# Patient Record
Sex: Male | Born: 2000 | Race: Black or African American | Hispanic: No | Marital: Single | Smoking: Never smoker
Health system: Southern US, Community
[De-identification: ages and names within clinical notes are randomized; demographics above are authoritative.]

## PROBLEM LIST (undated history)

## (undated) DIAGNOSIS — F32A Depression, unspecified: Secondary | ICD-10-CM

## (undated) DIAGNOSIS — F909 Attention-deficit hyperactivity disorder, unspecified type: Secondary | ICD-10-CM

## (undated) DIAGNOSIS — E669 Obesity, unspecified: Secondary | ICD-10-CM

## (undated) DIAGNOSIS — H109 Unspecified conjunctivitis: Secondary | ICD-10-CM

## (undated) DIAGNOSIS — J45909 Unspecified asthma, uncomplicated: Secondary | ICD-10-CM

## (undated) DIAGNOSIS — F418 Other specified anxiety disorders: Secondary | ICD-10-CM

## (undated) DIAGNOSIS — F509 Eating disorder, unspecified: Secondary | ICD-10-CM

## (undated) DIAGNOSIS — G4730 Sleep apnea, unspecified: Secondary | ICD-10-CM

## (undated) DIAGNOSIS — L309 Dermatitis, unspecified: Secondary | ICD-10-CM

## (undated) DIAGNOSIS — R011 Cardiac murmur, unspecified: Secondary | ICD-10-CM

## (undated) DIAGNOSIS — F419 Anxiety disorder, unspecified: Secondary | ICD-10-CM

## (undated) DIAGNOSIS — F329 Major depressive disorder, single episode, unspecified: Secondary | ICD-10-CM

## (undated) DIAGNOSIS — T7840XA Allergy, unspecified, initial encounter: Secondary | ICD-10-CM

## (undated) HISTORY — DX: Eating disorder, unspecified: F50.9

## (undated) HISTORY — DX: Unspecified asthma, uncomplicated: J45.909

## (undated) HISTORY — DX: Sleep apnea, unspecified: G47.30

## (undated) HISTORY — DX: Major depressive disorder, single episode, unspecified: F32.9

## (undated) HISTORY — DX: Unspecified conjunctivitis: H10.9

## (undated) HISTORY — DX: Allergy, unspecified, initial encounter: T78.40XA

## (undated) HISTORY — DX: Morbid (severe) obesity due to excess calories: E66.01

## (undated) HISTORY — DX: Depression, unspecified: F32.A

## (undated) HISTORY — DX: Attention-deficit hyperactivity disorder, unspecified type: F90.9

## (undated) HISTORY — DX: Obesity, unspecified: E66.9

## (undated) HISTORY — DX: Anxiety disorder, unspecified: F41.9

## (undated) HISTORY — PX: CIRCUMCISION: SUR203

---
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
## 2021-03-17 ENCOUNTER — Other Ambulatory Visit: Payer: Self-pay | Admitting: Pediatrics

## 2021-03-18 ENCOUNTER — Telehealth (HOSPITAL_COMMUNITY): Payer: Self-pay

## 2021-03-18 NOTE — Telephone Encounter (Signed)
Called patient to see if he is interested in the Cardiac Rehab Program. Patient expressed interest. Explained scheduling process and went over insurance, patient verbalized understanding.  ?

## 2021-03-18 NOTE — Telephone Encounter (Signed)
Pt insurance is active and benefits verified through [place]. Co-pay $0.00, DED $750.00/$750.00 met, out of pocket $2,250.00/$818.22 met, co-insurance 20%. No pre-authorization required. Shaine/Cinga, 03/18/21 @ 3:34PM, [id number]   Will contact patient to see if he is interested in the Cardiac Rehab Program.

## 2021-03-18 NOTE — Telephone Encounter (Signed)
Outside/paper referral received by Dr. Casilda Carls from [place]. Will fax over Physician order and request further documents. Insurance benefits and eligibility to be determined.

## 2021-03-30 ENCOUNTER — Other Ambulatory Visit: Payer: Self-pay | Admitting: Pediatrics

## 2021-04-12 ENCOUNTER — Telehealth (HOSPITAL_COMMUNITY): Payer: Self-pay

## 2021-04-12 NOTE — Telephone Encounter (Signed)
Refaxed pt MD order and other paperwork that needs to be filled out signed by the MD to Dr. Kennon Rounds office. 2nd request for cardiac rehab.

## 2021-04-16 NOTE — Telephone Encounter (Signed)
Called patient to see if he was interested in participating in the Cardiac Rehab Program. Patient's mother stated yes. Patient will come in for orientation on 05/20/2021@8 :00am and will attend the 3:15pm exercise class.   Pensions consultant.

## 2021-05-18 ENCOUNTER — Telehealth (HOSPITAL_COMMUNITY): Payer: Self-pay

## 2021-05-20 ENCOUNTER — Encounter (HOSPITAL_COMMUNITY): Payer: Self-pay

## 2021-05-20 ENCOUNTER — Encounter (HOSPITAL_COMMUNITY)
Admission: RE | Admit: 2021-05-20 | Discharge: 2021-05-20 | Disposition: A | Discharge status: Home / Self Care | Payer: Managed Care, Other (non HMO) | Source: Ambulatory Visit | Attending: Cardiology | Admitting: Cardiology

## 2021-05-20 ENCOUNTER — Other Ambulatory Visit (HOSPITAL_COMMUNITY): Payer: Self-pay | Admitting: *Deleted

## 2021-05-20 ENCOUNTER — Other Ambulatory Visit: Payer: Self-pay

## 2021-05-20 VITALS — BP 118/78 | HR 86 | Ht 67.0 in | Wt >= 6400 oz

## 2021-05-20 DIAGNOSIS — I422 Other hypertrophic cardiomyopathy: Secondary | ICD-10-CM | POA: Insufficient documentation

## 2021-05-20 NOTE — Progress Notes (Signed)
Cardiac Rehab Medication Review by a Nurse  Does the patient  feel that his/her medications are working for him/her?  YES   Has the patient been experiencing any side effects to the medications prescribed?   NO  Does the patient measure his/her own blood pressure or blood glucose at home?  YES   Does the patient have any problems obtaining medications due to transportation or finances?   NO  Understanding of regimen: good Understanding of indications: good Potential of compliance: good    Nurse comments: [name] is taking his medications as prescribed and has a good understanding of what his medications are for. [name] says that his mother has a blood pressure monitor. Devin Marshall checks his blood pressures sometimes at home.    [name] [facility] RN 05/20/2021 9:20 AM

## 2021-05-20 NOTE — Progress Notes (Addendum)
Cardiac Individual Treatment Plan  Patient Details  Name: Devin Marshall MRN: [id number] Date of Birth: 05-09-2001 Referring Provider:   Flowsheet Row CARDIAC REHAB PHASE II ORIENTATION from 05/20/2021 in [facility] CARDIAC REHAB  Referring Provider Dr Trisha Mangle MD, [facility]/ Armanda Magic, MD  (coverage)       Initial Encounter Date:  Flowsheet Row CARDIAC REHAB PHASE II ORIENTATION from 05/20/2021 in [facility] CARDIAC REHAB  Date 05/20/21       Visit Diagnosis: Hypertrophic cardiomyopathy (HCC)  Patient's Home Medications on Admission:  Current Outpatient Medications:    albuterol (PROVENTIL) (2.5 MG/3ML) 0.083% nebulizer solution, Take 3 mLs (2.5 mg total) by nebulization every 4 (four) hours as needed for wheezing or shortness of breath., Disp: 75 mL, Rfl: 12   albuterol (VENTOLIN HFA) 108 (90 Base) MCG/ACT inhaler, INHALE 2 PUFFS INTO THE LUNGS EVERY 4 HOURS AS NEEDED FOR WHEEZING OR SHORTNESS OF BREATH., Disp: 6.7 each, Rfl: 13   budesonide (PULMICORT) 0.5 MG/2ML nebulizer solution, TAKE 2 MLS (0.5 MG TOTAL) BY NEBULIZATION 2 (TWO) TIMES DAILY. (Patient taking differently: Take 0.5 mg by nebulization 2 (two) times daily as needed (shortness of breath).), Disp: 360 mL, Rfl: 4   metoprolol succinate (TOPROL-XL) 50 MG 24 hr tablet, Take 50 mg by mouth daily., Disp: , Rfl:    pantoprazole (PROTONIX) 40 MG tablet, Take 40 mg by mouth daily., Disp: , Rfl:    fluticasone (FLOVENT HFA) 110 MCG/ACT inhaler, Inhale 2 puffs into the lungs 2 (two) times daily. (Patient not taking: Reported on 05/20/2021), Disp: 1 Inhaler, Rfl: 12  Past Medical History: Past Medical History:  Diagnosis Date   ADHD (attention deficit hyperactivity disorder)    Allergy    Anxiety    Asthma    Asthma    Phreesia 01/28/2020   Conjunctivitis 05/08/2013   Depression    Eating disorder    Eczema    Morbid obesity (HCC)    Obesity    Situational anxiety    Sleep  apnea    Undiagnosed cardiac murmurs 12/10/2012    Tobacco Use: Social History   Tobacco Use  Smoking Status Never   Passive exposure: Yes  Smokeless Tobacco Never  Tobacco Comments   both parents smoke    Labs: Recent Review Flowsheet Data     Labs for ITP Cardiac and Pulmonary Rehab Latest Ref Rng & Units 07/30/2013 01/29/2014 08/05/2014 06/18/2015 10/12/2017   Cholestrol 100 - 169 mg/dL - - - 409 811(B)   LDLCALC 0 - 109 mg/dL - - - 147(W) 295(A)   HDL >39 mg/dL - - - 21(H) 08(M)   Trlycerides 0 - 89 mg/dL - - - 578 72   Hemoglobin A1c 4.8 - 5.6 % 5.1 5.1 - 5.3 5.2   TCO2 0 - 100 mmol/L - - 24 - -       Capillary Blood Glucose: No results found for: GLUCAP   Exercise Target Goals: Exercise Program Goal: Individual exercise prescription set using results from initial 6 min walk test and THRR while considering  patient's activity barriers and safety.   Exercise Prescription Goal: Starting with aerobic activity 30 plus minutes a day, 3 days per week for initial exercise prescription. Provide home exercise prescription and guidelines that participant acknowledges understanding prior to discharge.  Activity Barriers & Risk Stratification:  Activity Barriers & Cardiac Risk Stratification - 05/20/21 1002       Activity Barriers & Cardiac Risk Stratification   Activity  Barriers Deconditioning;Muscular Weakness;Shortness of Breath;Decreased Ventricular Function;Balance Concerns;Other (comment)    Comments Morbid Obesity    Cardiac Risk Stratification High             6 Minute Walk:  6 Minute Walk     Row Name 05/20/21 1000         6 Minute Walk   Phase Initial     Distance 1181 feet  Used Nustep for test     Walk Time 6 minutes     # of Rest Breaks 0     MPH 2.34     METS 2.98     RPE 8     Perceived Dyspnea  1     VO2 Peak 10.43     Symptoms Yes (comment)     Comments Bilateral hip pain 3/10; SOB RPD = 1     Resting HR 91 bpm     Resting BP 118/78      Resting Oxygen Saturation  98 %     Exercise Oxygen Saturation  during 6 min walk 97 %     Max Ex. HR 94 bpm     Max Ex. BP 124/84     2 Minute Post BP 114/76              Oxygen Initial Assessment:   Oxygen Re-Evaluation:   Oxygen Discharge (Final Oxygen Re-Evaluation):   Initial Exercise Prescription:  Initial Exercise Prescription - 05/20/21 1000       Date of Initial Exercise RX and Referring Provider   Date 05/20/21    Referring Provider Dr Trisha Mangle MD, [facility]/ Armanda Magic, MD  (coverage)    Expected Discharge Date 07/16/21      T5 Nustep   Level 2    SPM 75    Minutes 25    METs 1.8      Prescription Details   Frequency (times per week) 3    Duration Progress to 30 minutes of continuous aerobic without signs/symptoms of physical distress      Intensity   THRR 40-80% of Max Heartrate 81-161    Ratings of Perceived Exertion 11-13    Perceived Dyspnea 0-4      Progression   Progression Continue progressive overload as per policy without signs/symptoms or physical distress.      Resistance Training   Training Prescription Yes    Weight 5 lbs    Reps 10-15             Perform Capillary Blood Glucose checks as needed.  Exercise Prescription Changes:   Exercise Comments:   Exercise Goals and Review:   Exercise Goals     Row Name 05/20/21 1007             Exercise Goals   Increase Physical Activity Yes       Intervention Provide advice, education, support and counseling about physical activity/exercise needs.;Develop an individualized exercise prescription for aerobic and resistive training based on initial evaluation findings, risk stratification, comorbidities and participant's personal goals.       Expected Outcomes Short Term: Attend rehab on a regular basis to increase amount of physical activity.;Long Term: Add in home exercise to make exercise part of routine and to increase amount of physical activity.;Long Term:  Exercising regularly at least 3-5 days a week.       Increase Strength and Stamina Yes       Intervention Provide advice, education, support and counseling about physical activity/exercise needs.;Develop  an individualized exercise prescription for aerobic and resistive training based on initial evaluation findings, risk stratification, comorbidities and participant's personal goals.       Expected Outcomes Short Term: Increase workloads from initial exercise prescription for resistance, speed, and METs.;Short Term: Perform resistance training exercises routinely during rehab and add in resistance training at home;Long Term: Improve cardiorespiratory fitness, muscular endurance and strength as measured by increased METs and functional capacity ( )       Able to understand and use rate of perceived exertion (RPE) scale Yes       Intervention Provide education and explanation on how to use RPE scale       Expected Outcomes Short Term: Able to use RPE daily in rehab to express subjective intensity level;Long Term:  Able to use RPE to guide intensity level when exercising independently       Knowledge and understanding of Target Heart Rate Range (THRR) Yes       Intervention Provide education and explanation of THRR including how the numbers were predicted and where they are located for reference       Expected Outcomes Short Term: Able to state/look up THRR;Short Term: Able to use daily as guideline for intensity in rehab;Long Term: Able to use THRR to govern intensity when exercising independently       Understanding of Exercise Prescription Yes       Intervention Provide education, explanation, and written materials on patient's individual exercise prescription       Expected Outcomes Short Term: Able to explain program exercise prescription;Long Term: Able to explain home exercise prescription to exercise independently                Exercise Goals Re-Evaluation :    Discharge Exercise  Prescription (Final Exercise Prescription Changes):   Nutrition:  Target Goals: Understanding of nutrition guidelines, daily intake of sodium 1500mg , cholesterol 200mg , calories 30% from fat and 7% or less from saturated fats, daily to have 5 or more servings of fruits and vegetables.  Biometrics:  Pre Biometrics - 05/20/21 0955       Pre Biometrics   Waist Circumference 68 inches    Hip Circumference 66.5 inches    Waist to Hip Ratio 1.02 %    Triceps Skinfold 70 mm    % Body Fat 62.6 %    Grip Strength 35 kg    Flexibility --   Not done   Single Leg Stand 5.61 seconds              Nutrition Therapy Plan and Nutrition Goals:   Nutrition Assessments:  MEDIFICTS Score Key: ?70 Need to make dietary changes  40-70 Heart Healthy Diet ? 40 Therapeutic Level Cholesterol Diet   Picture Your Plate Scores: <32 Unhealthy dietary pattern with much room for improvement. 41-50 Dietary pattern unlikely to meet recommendations for good health and room for improvement. 51-60 More healthful dietary pattern, with some room for improvement.  >60 Healthy dietary pattern, although there may be some specific behaviors that could be improved.    Nutrition Goals Re-Evaluation:   Nutrition Goals Discharge (Final Nutrition Goals Re-Evaluation):   Psychosocial: Target Goals: Acknowledge presence or absence of significant depression and/or stress, maximize coping skills, provide positive support system. Participant is able to verbalize types and ability to use techniques and skills needed for reducing stress and depression.  Initial Review & Psychosocial Screening:  Initial Psych Review & Screening - 05/20/21 9518       Initial Review  Current issues with Current Depression;History of Depression;Current Stress Concerns    Source of Stress Concerns Chronic Illness;Unable to participate in former interests or hobbies;Unable to perform yard/household activities;Transportation     Comments Darryll is unable to participate in many ADL's due to morbid obesity. Torey graduated from high school in June and would like to enroll in school to learn a trade      [employer]   Good Support System? Yes   Oluwadamilola has his parents, and younger brother for suport     Barriers   Psychosocial barriers to participate in program The patient should benefit from training in stress management and relaxation.      Screening Interventions   Interventions Encouraged to exercise;To provide support and resources with identified psychosocial needs;Provide feedback about the scores to participant    Expected Outcomes Long Term Goal: Stressors or current issues are controlled or eliminated.;Short Term goal: Utilizing psychosocial counselor, staff and physician to assist with identification of specific Stressors or current issues interfering with healing process. Setting desired goal for each stressor or current issue identified.;Short Term goal: Identification and review with participant of any Quality of Life or Depression concerns found by scoring the questionnaire.;Long Term goal: The participant improves quality of Life and PHQ9 Scores as seen by post scores and/or verbalization of changes             Quality of Life Scores:  Quality of Life - 05/20/21 1040       Quality of Life   Select Quality of Life      Quality of Life Scores   Health/Function Pre 17.63 %    Socioeconomic Pre 23.94 %    Psych/Spiritual Pre 27.43 %    Family Pre 15.9 %    GLOBAL Pre 20.79 %            Scores of 19 and below usually indicate a poorer quality of life in these areas.  A difference of  2-3 points is a clinically meaningful difference.  A difference of 2-3 points in the total score of the Quality of Life Index has been associated with significant improvement in overall quality of life, self-image, physical symptoms, and general health in studies assessing change in quality of  life.  PHQ-9: Recent Review Flowsheet Data     Depression screen [facility] 2/9 05/20/2021 08/27/2020 10/12/2017 08/16/2017 08/10/2016   Decreased Interest 1 0 3 2 0   Down, Depressed, Hopeless 2 2 3 3 1    PHQ - 2 Score 3 2 6 5 1    Altered sleeping 3 3 3 2  0   Tired, decreased energy 2 1 2 2  0   Change in appetite 2 0 3 2 1    Feeling bad or failure about yourself  2 2 3 2  0   Trouble concentrating 3 3 3 1  0   Moving slowly or fidgety/restless 0 1 0 2 0   Suicidal thoughts 0 - 0 0 0   PHQ-9 Score 15 12 20 16 2    Difficult doing work/chores Somewhat difficult - Not difficult at all Somewhat difficult -      Interpretation of Total Score  Total Score Depression Severity:  1-4 = Minimal depression, 5-9 = Mild depression, 10-14 = Moderate depression, 15-19 = Moderately severe depression, 20-27 = Severe depression   Psychosocial Evaluation and Intervention:   Psychosocial Re-Evaluation:   Psychosocial Discharge (Final Psychosocial Re-Evaluation):   Vocational Rehabilitation: Provide vocational rehab assistance to qualifying candidates.   Vocational  Rehab Evaluation & Intervention:  Vocational Rehab - 05/20/21 0856       Initial Vocational Rehab Evaluation & Intervention   Assessment shows need for Vocational Rehabilitation Yes    Vocational Rehab Packet given to patient 05/20/21             Education: Education Goals: Education classes will be provided on a weekly basis, covering required topics. Participant will state understanding/return demonstration of topics presented.  Learning Barriers/Preferences:  Learning Barriers/Preferences - 05/20/21 0957       Learning Barriers/Preferences   Learning Barriers Sight   wears glasses for reading; H/O ADHD   Learning Preferences Audio;Pictoral;Skilled Demonstration;Computer/Internet;Group Instruction;Verbal Instruction;Individual Instruction;Video;Written Material             Education Topics: Hypertension, Hypertension  Reduction -Define heart disease and high blood pressure. Discus how high blood pressure affects the body and ways to reduce high blood pressure.   Exercise and Your Heart -Discuss why it is important to exercise, the FITT principles of exercise, normal and abnormal responses to exercise, and how to exercise safely.   Angina -Discuss definition of angina, causes of angina, treatment of angina, and how to decrease risk of having angina.   Cardiac Medications -Review what the following cardiac medications are used for, how they affect the body, and side effects that may occur when taking the medications.  Medications include Aspirin, Beta blockers, calcium channel blockers, ACE Inhibitors, angiotensin receptor blockers, diuretics, digoxin, and antihyperlipidemics.   Congestive Heart Failure -Discuss the definition of CHF, how to live with CHF, the signs and symptoms of CHF, and how keep track of weight and sodium intake.   Heart Disease and Intimacy -Discus the effect sexual activity has on the heart, how changes occur during intimacy as we age, and safety during sexual activity.   Smoking Cessation / COPD -Discuss different methods to quit smoking, the health benefits of quitting smoking, and the definition of COPD.   Nutrition I: Fats -Discuss the types of cholesterol, what cholesterol does to the heart, and how cholesterol levels can be controlled.   Nutrition II: Labels -Discuss the different components of food labels and how to read food label   Heart Parts/Heart Disease and PAD -Discuss the anatomy of the heart, the pathway of blood circulation through the heart, and these are affected by heart disease.   Stress I: Signs and Symptoms -Discuss the causes of stress, how stress may lead to anxiety and depression, and ways to limit stress.   Stress II: Relaxation -Discuss different types of relaxation techniques to limit stress.   Warning Signs of Stroke / TIA -Discuss  definition of a stroke, what the signs and symptoms are of a stroke, and how to identify when someone is having stroke.   Knowledge Questionnaire Score:  Knowledge Questionnaire Score - 05/20/21 1041       Knowledge Questionnaire Score   Pre Score 21/24             Core Components/Risk Factors/Patient Goals at Admission:  Personal Goals and Risk Factors at Admission - 05/20/21 0959       Core Components/Risk Factors/Patient Goals on Admission    Weight Management Yes;Obesity;Weight Loss    Intervention Weight Management: Develop a combined nutrition and exercise program designed to reach desired caloric intake, while maintaining appropriate intake of nutrient and fiber, sodium and fats, and appropriate energy expenditure required for the weight goal.;Weight Management: Provide education and appropriate resources to help participant work on and attain dietary  goals.;Weight Management/Obesity: Establish reasonable short term and long term weight goals.;Obesity: Provide education and appropriate resources to help participant work on and attain dietary goals.    Admit Weight 438 lb 15 oz (199.1 kg)    Expected Outcomes Understanding of distribution of calorie intake throughout the day with the consumption of 4-5 meals/snacks;Understanding recommendations for meals to include 15-35% energy as protein, 25-35% energy from fat, 35-60% energy from carbohydrates, less than 200mg  of dietary cholesterol, 20-35 gm of total fiber daily;Weight Loss: Understanding of general recommendations for a balanced deficit meal plan, which promotes 1-2 lb weight loss per week and includes a negative energy balance of [phone] kcal/d;Weight Maintenance: Understanding of the daily nutrition guidelines, which includes 25-35% calories from fat, 7% or less cal from saturated fats, less than 200mg  cholesterol, less than 1.5gm of sodium, & 5 or more servings of fruits and vegetables daily;Long Term: Adherence to nutrition  and physical activity/exercise program aimed toward attainment of established weight goal;Short Term: Continue to assess and modify interventions until short term weight is achieved    Improve shortness of breath with ADL's Yes    Intervention Provide education, individualized exercise plan and daily activity instruction to help decrease symptoms of SOB with activities of daily living.    Expected Outcomes Short Term: Improve cardiorespiratory fitness to achieve a reduction of symptoms when performing ADLs;Long Term: Be able to perform more ADLs without symptoms or delay the onset of symptoms    Heart Failure Yes    Intervention Provide a combined exercise and nutrition program that is supplemented with education, support and counseling about heart failure. Directed toward relieving symptoms such as shortness of breath, decreased exercise tolerance, and extremity edema.    Expected Outcomes Improve functional capacity of life;Short term: Attendance in program 2-3 days a week with increased exercise capacity. Reported lower sodium intake. Reported increased fruit and vegetable intake. Reports medication compliance.;Short term: Daily weights obtained and reported for increase. Utilizing diuretic protocols set by physician.;Long term: Adoption of self-care skills and reduction of barriers for early signs and symptoms recognition and intervention leading to self-care maintenance.    Hypertension Yes    Intervention Provide education on lifestyle modifcations including regular physical activity/exercise, weight management, moderate sodium restriction and increased consumption of fresh fruit, vegetables, and low fat dairy, alcohol moderation, and smoking cessation.;Monitor prescription use compliance.    Expected Outcomes Short Term: Continued assessment and intervention until BP is < 140/35mm HG in hypertensive participants. < 130/46mm HG in hypertensive participants with diabetes, heart failure or chronic  kidney disease.;Long Term: Maintenance of blood pressure at goal levels.    Stress Yes    Intervention Offer individual and/or small group education and counseling on adjustment to heart disease, stress management and health-related lifestyle change. Teach and support self-help strategies.;Refer participants experiencing significant psychosocial distress to appropriate mental health specialists for further evaluation and treatment. When possible, include family members and significant others in education/counseling sessions.    Expected Outcomes Short Term: Participant demonstrates changes in health-related behavior, relaxation and other stress management skills, ability to obtain effective social support, and compliance with psychotropic medications if prescribed.;Long Term: Emotional wellbeing is indicated by absence of clinically significant psychosocial distress or social isolation.             Core Components/Risk Factors/Patient Goals Review:    Core Components/Risk Factors/Patient Goals at Discharge (Final Review):    ITP Comments:  ITP Comments     Row Name 05/20/21 [phone]  ITP Comments Dr Armanda Magic MD, Medical Director                Comments: Jaegar attended orientation on 05/20/2021 to review rules and guidelines for program.  Completed 6 minute nustep  test, A 6 minute nustep test was Initiated rather than a 6 minute walk test as [name] did not bring his rescue inhaler today. Patient knows to bring his inhaler to his next appointment.  ITP, and exercise prescription.  VSS. Telemetry-Sinus Rhythm. Kennon did report having 3/10 bilateral hip pain and 1/4 shortness of breath. This resolved with rest.  Safety measures and social distancing in place per CDC guidelines. Mart's weight is up since his last office visit in July with his cardiologist at [facility] with Dr Casilda Carls. Left message at Dr Kennon Rounds office about Dewell's current weight. Truitt says  that he is still dealing with depression will ask Cardiologist or Dr Loel Ro about receiving counseling. Bashir was also given a vocational rehab referral form. [place] is interested in going to school. Devinn is interested in attending community college and possibly a 4 year college. Transportation was set up for Kiptyn to participate in cardiac rehab as he does not drive or have transportation. Lanorris's mother is starting a job with social services on Monday.  A referral was initiated for [facility] to receive nutrition counseling.Gladstone Lighter, RN,BSN 05/20/2021 1:09 PM

## 2021-05-21 ENCOUNTER — Telehealth: Payer: Self-pay

## 2021-05-21 NOTE — Telephone Encounter (Signed)
Cardiac rehab reach out concerning Devin Marshall's weight increasing to 139lb as well as a conversation concerning depression. They decided to continue with cardiac rehab and also scheduled him for Nutrition Management starting in November but they called asking to start with Behavioral health concerning the depression.   Office still would like to speak to Dr. [name] give call back number of [phone]

## 2021-05-24 ENCOUNTER — Encounter (HOSPITAL_COMMUNITY)
Admission: RE | Admit: 2021-05-24 | Discharge: 2021-05-24 | Disposition: A | Discharge status: Home / Self Care | Payer: Managed Care, Other (non HMO) | Source: Ambulatory Visit | Attending: Cardiology | Admitting: Cardiology

## 2021-05-24 ENCOUNTER — Other Ambulatory Visit: Payer: Self-pay

## 2021-05-24 ENCOUNTER — Ambulatory Visit (HOSPITAL_COMMUNITY): Payer: Managed Care, Other (non HMO)

## 2021-05-24 DIAGNOSIS — I422 Other hypertrophic cardiomyopathy: Secondary | ICD-10-CM

## 2021-05-24 NOTE — Progress Notes (Signed)
QUALITY OF LIFE SCORE REVIEW  Pt completed Quality of Life survey as a participant in Cardiac Rehab.  Scores 21.0 or below are considered low.  Pt score very low in several areas Overall 20.79, Health and Function 17.63, socioeconomic 23.94, physiological and spiritual 27.43, family 15.90. Patient quality of life slightly altered by physical constraints which limits ability to perform as prior to recent cardiac illness.Devin Marshall admitted to being depressed during orientation. Denies suicidal ideations.  Offered emotional support and reassurance. Devin Marshall admits to being dissatisfied with his weight and is worried about his heart. Devin Marshall also voices concerns about not having a job. Awaiting Devin Marshall to return the paper referral for vocational rehab.   Will continue to monitor and intervene as necessary.  Devin Marshall has an appointment with Ernest Haber tomorrow.Will obtain fax number to fax paper copy of quality of life questionnaire.Thayer Headings RN BSN

## 2021-05-24 NOTE — Progress Notes (Signed)
Daily Session Note  Patient Details  Name: Devin Marshall MRN: [id number] Date of Birth: [birth year] Referring Provider:   Flowsheet Row CARDIAC REHAB PHASE II ORIENTATION from 05/20/2021 in [place]  Referring Provider Dr Gaylyn Cheers MD, [facility]/ Fransico Him, MD  (coverage)       Encounter Date: 05/24/2021  Check In:  Session Check In - 05/24/21 1323       Check-In   Supervising physician immediately available to respond to emergencies [facility] immediately available    Physician(s) Dr. Florene Glen    Location [facility]    Staff Present Barnet Pall, RN, Milus Glazier, MS, ACSM-CEP, CCRP, Exercise Physiologist;Olinty Celesta Aver, MS, ACSM CEP, Exercise Physiologist;Carlette Wilber Oliphant, RN, BSN;Ramon Dredge, RN, MHA    Virtual Visit No    Medication changes reported     No    Fall or balance concerns reported    No    Tobacco Cessation No Change    Current number of cigarettes/nicotine per day     0    Warm-up and Cool-down Performed on first and last piece of equipment    Resistance Training Performed Yes    VAD Patient? No    PAD/SET Patient? No      Pain Assessment   Currently in Pain? No/denies    Pain Score 0-No pain    Multiple Pain Sites No             Capillary Blood Glucose: No results found for this or any previous visit (from the past 24 hour(s)).   Exercise Prescription Changes - 05/24/21 1400       Response to Exercise   Blood Pressure (Admit) 118/60    Blood Pressure (Exercise) 124/80    Blood Pressure (Exit) 114/70    Heart Rate (Admit) 99 bpm    Heart Rate (Exercise) 107 bpm    Heart Rate (Exit) 89 bpm    Rating of Perceived Exertion (Exercise) 13    Symptoms Fatigue/Deconditioning    Comments Pt's first day in the CRP2 program    Duration Continue with 30 min of aerobic exercise without signs/symptoms of physical distress.    Intensity THRR unchanged      Progression    Progression Continue to progress workloads to maintain intensity without signs/symptoms of physical distress.    Average METs 1.8      Resistance Training   Training Prescription Yes    Weight 5 lbs    Reps 10-15    Time 10 Minutes      Interval Training   Interval Training No      T5 Nustep   Level 2    SPM 75    Minutes 20    METs 1.8             Social History   Tobacco Use  Smoking Status Never   Passive exposure: Yes  Smokeless Tobacco Never  Tobacco Comments   both parents smoke    Goals Met:  Strength training completed today  Goals Unmet:  Not Applicable  Comments: Devin Marshall started cardiac rehab today.  Pt tolerated light exercise using rest breaks and stopped at 20 minutes on the nustep. Patient is very deconditioned. Devin Marshall reported having left hip pain. VSS, telemetry-Sinus Rhythm, Medication list reconciled. Pt denies barriers to medicaiton compliance.  PSYCHOSOCIAL ASSESSMENT:  PHQ-15.Devin Marshall admits to being depressed due to his weight and heart condition. Devin Marshall does have a supportive family including  his parents and  brothers . Quality of life questionnaire reviewed please see note.    Pt enjoys cars and car maintenance.   Pt oriented to exercise equipment and routine.    Understanding verbalized. Barnet Pall, RN,BSN 05/25/2021 8:09 AM    Dr. Fransico Him is Medical Director for Cardiac Rehab at [facility].

## 2021-05-26 ENCOUNTER — Ambulatory Visit (HOSPITAL_COMMUNITY): Payer: Managed Care, Other (non HMO)

## 2021-05-26 ENCOUNTER — Other Ambulatory Visit: Payer: Self-pay

## 2021-05-26 ENCOUNTER — Encounter (HOSPITAL_COMMUNITY)
Admission: RE | Admit: 2021-05-26 | Discharge: 2021-05-26 | Disposition: A | Discharge status: Home / Self Care | Payer: Managed Care, Other (non HMO) | Source: Ambulatory Visit | Attending: Cardiology | Admitting: Cardiology

## 2021-05-26 DIAGNOSIS — I422 Other hypertrophic cardiomyopathy: Secondary | ICD-10-CM

## 2021-05-27 ENCOUNTER — Ambulatory Visit (INDEPENDENT_AMBULATORY_CARE_PROVIDER_SITE_OTHER): Payer: Managed Care, Other (non HMO) | Admitting: Clinical

## 2021-05-27 DIAGNOSIS — F4323 Adjustment disorder with mixed anxiety and depressed mood: Secondary | ICD-10-CM

## 2021-05-27 NOTE — BH Specialist Note (Signed)
Integrated Behavioral Health via Telemedicine Visit  05/27/2021 Devin Marshall [id number]  11:06am- Sent video link [phone] 11:12pm - TC to [place] - will get on video link   Number of Integrated Behavioral Health visits: 1  Session Start time: 11:15am  Session End time: 12pm Total time: 45   Referring Provider: Cardiac Rehab Team (Dr. [name] is PCP) Patient/Family location: Pt's home [facility] Provider location: [facility] All persons participating in visit: Devin Marshall) Types of Service: Individual psychotherapy and Video visit  I connected with Devin Marshall via  Telephone or Video Enabled Telemedicine Application  (Video is Caregility application) and verified that I am speaking with the correct person using two identifiers. Discussed confidentiality: Yes   I discussed the limitations of telemedicine and the availability of in person appointments.  Discussed there is a possibility of technology failure and discussed alternative modes of communication if that failure occurs.  I discussed that engaging in this telemedicine visit, they consent to the provision of behavioral healthcare and the services will be billed under their insurance.  Patient and/or legal guardian expressed understanding and consented to Telemedicine visit: Yes   Presenting Concerns: Patient and/or family reports the following symptoms/concerns:  - wants to take care of himself more but having difficulties with doing that due to depressive symptoms and lack of confidence Duration of problem: months; Severity of problem: moderate  Patient and/or Family's Strengths/Protective Factors: Social and Emotional competence, Concrete supports in place (healthy food, safe environments, etc.), and Sense of purpose  Goals Addressed: Patient will:  Increase knowledge and/or ability of: self-management skills - to improve his quality of life.  Demonstrate ability to:  consistently  put on CPAP at night time in order to improve his quality of sleep.  Progress towards Goals: Ongoing  Interventions: Interventions utilized:  Motivational Interviewing, Mining engineer, and Link to [employer] Assessments completed: PHQ-SADS PHQ-SADS Last 3 Score only 05/27/2021 05/20/2021 08/27/2020  PHQ-15 Score 11 - 7  Total GAD-7 Score 12 - 10  PHQ Adolescent Score 16 15 12     Patient and/or Family Response:  Devin Marshall reported moderately severe symptoms of depression.  He is motivated to change and has started to act on caring for himself, eg taking showers each day and starting to clean his room. Devin Marshall is adjusting to not having a set schedule since he graduated high school earlier in the year.  Devin Marshall was able to identify self-care activities and develop a plan to improve his sleep, which he acknowledges will help him feel better in general.  Self-Care: Making music, Making cars (mod) video games) Taking showers & cleaning room  Assessment: Patient currently experiencing depressive symptoms which has made it difficult for him to take care of himself.  He is motivated to do things differently and was able to develop a plan to improve his sleep by putting on his CPAP.  Devin Marshall identified different solutions to help him put on his CPAP including reminders and positive self-talk.   Patient may benefit from completing his plan to put on his CPAP at night to improve his quality of sleep which will improve his quality of life.  That accomplishment will get him more motivated to complete other self-care activities.  Plan: Follow up with behavioral health clinician on : 06/03/21 virtual follow up until he gets fully connected with psycho therapy in the community Behavioral recommendations:  - Set reminders to put on his CPAP & put it on at night -  Follow up with psycho therapy Referral(s): Community Mental Health Services (LME/Outside Clinic) - Devin Marshall  wanted to be referred for community based psycho therapy  I discussed the assessment and treatment plan with the patient and/or parent/guardian. They were provided an opportunity to ask questions and all were answered. They agreed with the plan and demonstrated an understanding of the instructions.   They were advised to call back or seek an in-person evaluation if the symptoms worsen or if the condition fails to improve as anticipated.  Tiziana Cislo Ed Blalock, LCSW

## 2021-05-27 NOTE — Telephone Encounter (Signed)
Will refer to psychiatry and dietitian

## 2021-05-28 ENCOUNTER — Other Ambulatory Visit: Payer: Self-pay

## 2021-05-28 ENCOUNTER — Encounter (HOSPITAL_COMMUNITY): Payer: Managed Care, Other (non HMO)

## 2021-05-28 ENCOUNTER — Ambulatory Visit (HOSPITAL_COMMUNITY): Payer: Managed Care, Other (non HMO)

## 2021-05-28 DIAGNOSIS — E662 Morbid (severe) obesity with alveolar hypoventilation: Secondary | ICD-10-CM

## 2021-05-28 DIAGNOSIS — F411 Generalized anxiety disorder: Secondary | ICD-10-CM

## 2021-05-28 DIAGNOSIS — Z68.41 Body mass index (BMI) pediatric, greater than or equal to 95th percentile for age: Secondary | ICD-10-CM

## 2021-05-31 ENCOUNTER — Other Ambulatory Visit: Payer: Self-pay

## 2021-05-31 ENCOUNTER — Ambulatory Visit (HOSPITAL_COMMUNITY): Payer: Managed Care, Other (non HMO)

## 2021-05-31 ENCOUNTER — Encounter (HOSPITAL_COMMUNITY)
Admission: RE | Admit: 2021-05-31 | Discharge: 2021-05-31 | Disposition: A | Discharge status: Home / Self Care | Payer: Managed Care, Other (non HMO) | Source: Ambulatory Visit | Attending: Cardiology | Admitting: Cardiology

## 2021-05-31 DIAGNOSIS — I422 Other hypertrophic cardiomyopathy: Secondary | ICD-10-CM | POA: Insufficient documentation

## 2021-05-31 DIAGNOSIS — I5032 Chronic diastolic (congestive) heart failure: Secondary | ICD-10-CM | POA: Diagnosis present

## 2021-06-02 ENCOUNTER — Ambulatory Visit (HOSPITAL_COMMUNITY): Payer: Managed Care, Other (non HMO)

## 2021-06-02 ENCOUNTER — Encounter (HOSPITAL_COMMUNITY)
Admission: RE | Admit: 2021-06-02 | Discharge: 2021-06-02 | Disposition: A | Discharge status: Home / Self Care | Payer: Managed Care, Other (non HMO) | Source: Ambulatory Visit | Attending: Cardiology | Admitting: Cardiology

## 2021-06-02 ENCOUNTER — Other Ambulatory Visit: Payer: Self-pay

## 2021-06-02 DIAGNOSIS — I5032 Chronic diastolic (congestive) heart failure: Secondary | ICD-10-CM | POA: Diagnosis not present

## 2021-06-02 DIAGNOSIS — I422 Other hypertrophic cardiomyopathy: Secondary | ICD-10-CM

## 2021-06-03 ENCOUNTER — Ambulatory Visit (INDEPENDENT_AMBULATORY_CARE_PROVIDER_SITE_OTHER): Payer: Managed Care, Other (non HMO) | Admitting: Clinical

## 2021-06-03 DIAGNOSIS — F4323 Adjustment disorder with mixed anxiety and depressed mood: Secondary | ICD-10-CM

## 2021-06-03 NOTE — BH Specialist Note (Signed)
Integrated Behavioral Health via Telemedicine Visit  06/03/2021 Devin Marshall [id number]  Number of Integrated Behavioral Health visits: 2 Session Start time: 11:40 AMSession End time: 12:07 PM Total time:  27  min  Referring Provider: Dr. [name] & Cardiac Rehab Team Patient/Family location: Pt's home [facility] Provider location: [facility] All persons participating in visit: Devin Marshall & Devin Marshall. Devin Marshall [facility]) Types of Service: Individual psychotherapy and Video visit  I connected with Devin Marshall  via  Telephone or Video Enabled Telemedicine Application  (Video is Caregility application) and verified that I am speaking with the correct person using two identifiers. Discussed confidentiality: Yes   I discussed the limitations of telemedicine and the availability of in person appointments.  Discussed there is a possibility of technology failure and discussed alternative modes of communication if that failure occurs.  I discussed that engaging in this telemedicine visit, they consent to the provision of behavioral healthcare and the services will be billed under their insurance.  Patient and/or legal guardian expressed understanding and consented to Telemedicine visit: Yes   Presenting Concerns: Patient and/or family reports the following symptoms/concerns:  - Was able to do 3-4 nights with the CPAP, felt better  Duration of problem: weeks to months; Severity of problem: moderate  Patient and/or Family's Strengths/Protective Factors: Concrete supports in place (healthy food, safe environments, etc.) and Sense of purpose    Progress towards Goals: Ongoing  Interventions: Interventions utilized:  Motivational Interviewing and Goal Setting Standardized Assessments completed: Not Needed  Patient and/or Family Response:  Devin Marshall was able to do 3-4 nights with the CPAP and willing to do 5 nights this week since he reported he felt good after sleeping with the CPAP.   - put it  on at night (Sun, Mon, Tues) - sticky note that's bright pink on the CPAP helped him remember to put it on.  Devin Marshall also received intake packet to complete for [facility] so he's still filling it out.  Assessment: Patient currently experiencing improvement with sleep after putting on the CPAP 3-4 nights this past week.  Devin Marshall is recognizing how much he feels better with it on and motivated to do 5 nights this week.  Devin Marshall has a hard time completing tasks so the intake packet felt too long for him.  He was willing to break it down to smaller tasks by working on it 30 minutes today and 30 minutes tomorrow.   Patient may benefit from continuing to wear his CPAP since it improves his sleep and energy level.  He would also benefit from consistently doing it and developing this healthy habit.  Devin Marshall would also benefit from breaking down big tasks to smaller tasks and setting time limits.  Plan: Follow up with behavioral health clinician on : 06/17/21 Behavioral recommendations:  - Wear CPAP 5 nights a week - Work on intake forms for [facility] today (30 min) & tomorrow (30 min) Referral(s): Paramedic (LME/Outside Clinic) - Will need to complete [facility] intake forms first to have an appointment scheduled.  I discussed the assessment and treatment plan with the patient and/or parent/guardian. They were provided an opportunity to ask questions and all were answered. They agreed with the plan and demonstrated an understanding of the instructions.   They were advised to call back or seek an in-person evaluation if the symptoms worsen or if the condition fails to improve as anticipated.  Devin Marshall Devin Blalock, LCSW

## 2021-06-04 ENCOUNTER — Other Ambulatory Visit: Payer: Self-pay

## 2021-06-04 ENCOUNTER — Ambulatory Visit (HOSPITAL_COMMUNITY): Payer: Managed Care, Other (non HMO)

## 2021-06-04 ENCOUNTER — Encounter (HOSPITAL_COMMUNITY)
Admission: RE | Admit: 2021-06-04 | Discharge: 2021-06-04 | Disposition: A | Discharge status: Home / Self Care | Payer: Managed Care, Other (non HMO) | Source: Ambulatory Visit | Attending: Cardiology | Admitting: Cardiology

## 2021-06-04 DIAGNOSIS — I422 Other hypertrophic cardiomyopathy: Secondary | ICD-10-CM

## 2021-06-04 DIAGNOSIS — I5032 Chronic diastolic (congestive) heart failure: Secondary | ICD-10-CM | POA: Diagnosis not present

## 2021-06-07 ENCOUNTER — Encounter (HOSPITAL_COMMUNITY)
Admission: RE | Admit: 2021-06-07 | Discharge: 2021-06-07 | Disposition: A | Discharge status: Home / Self Care | Payer: Managed Care, Other (non HMO) | Source: Ambulatory Visit | Attending: Cardiology | Admitting: Cardiology

## 2021-06-07 ENCOUNTER — Other Ambulatory Visit: Payer: Self-pay

## 2021-06-07 ENCOUNTER — Ambulatory Visit (HOSPITAL_COMMUNITY): Payer: Managed Care, Other (non HMO)

## 2021-06-07 DIAGNOSIS — I5032 Chronic diastolic (congestive) heart failure: Secondary | ICD-10-CM | POA: Diagnosis not present

## 2021-06-07 DIAGNOSIS — I422 Other hypertrophic cardiomyopathy: Secondary | ICD-10-CM

## 2021-06-08 NOTE — Progress Notes (Signed)
Cardiac Individual Treatment Plan  Patient Details  Name: Devin Marshall MRN: [id number] Date of Birth: Jul 06, 2001 Referring Provider:   Flowsheet Row CARDIAC REHAB PHASE II ORIENTATION from 05/20/2021 in [facility] CARDIAC REHAB  Referring Provider Dr Trisha Mangle MD, [facility]/ Armanda Magic, MD  (coverage)       Initial Encounter Date:  Flowsheet Row CARDIAC REHAB PHASE II ORIENTATION from 05/20/2021 in [facility] CARDIAC REHAB  Date 05/20/21       Visit Diagnosis: Hypertrophic cardiomyopathy (HCC)  Morbid obesity (HCC)  Patient's Home Medications on Admission:  Current Outpatient Medications:    albuterol (PROVENTIL) (2.5 MG/3ML) 0.083% nebulizer solution, Take 3 mLs (2.5 mg total) by nebulization every 4 (four) hours as needed for wheezing or shortness of breath., Disp: 75 mL, Rfl: 12   albuterol (VENTOLIN HFA) 108 (90 Base) MCG/ACT inhaler, INHALE 2 PUFFS INTO THE LUNGS EVERY 4 HOURS AS NEEDED FOR WHEEZING OR SHORTNESS OF BREATH., Disp: 6.7 each, Rfl: 13   budesonide (PULMICORT) 0.5 MG/2ML nebulizer solution, TAKE 2 MLS (0.5 MG TOTAL) BY NEBULIZATION 2 (TWO) TIMES DAILY. (Patient taking differently: Take 0.5 mg by nebulization 2 (two) times daily as needed (shortness of breath).), Disp: 360 mL, Rfl: 4   fluticasone (FLOVENT HFA) 110 MCG/ACT inhaler, Inhale 2 puffs into the lungs 2 (two) times daily. (Patient not taking: Reported on 05/20/2021), Disp: 1 Inhaler, Rfl: 12   metoprolol succinate (TOPROL-XL) 50 MG 24 hr tablet, Take 50 mg by mouth daily., Disp: , Rfl:    pantoprazole (PROTONIX) 40 MG tablet, Take 40 mg by mouth daily., Disp: , Rfl:   Past Medical History: Past Medical History:  Diagnosis Date   ADHD (attention deficit hyperactivity disorder)    Allergy    Anxiety    Asthma    Asthma    Phreesia 01/28/2020   Conjunctivitis 05/08/2013   Depression    Eating disorder    Eczema    Morbid obesity (HCC)    Obesity     Situational anxiety    Sleep apnea    Undiagnosed cardiac murmurs 12/10/2012    Tobacco Use: Social History   Tobacco Use  Smoking Status Never   Passive exposure: Yes  Smokeless Tobacco Never  Tobacco Comments   both parents smoke    Labs: Recent Review Flowsheet Data     Labs for ITP Cardiac and Pulmonary Rehab Latest Ref Rng & Units 07/30/2013 01/29/2014 08/05/2014 06/18/2015 10/12/2017   Cholestrol 100 - 169 mg/dL - - - 093 818(E)   LDLCALC 0 - 109 mg/dL - - - 993(Z) 169(C)   HDL >39 mg/dL - - - 78(L) 38(B)   Trlycerides 0 - 89 mg/dL - - - 017 72   Hemoglobin A1c 4.8 - 5.6 % 5.1 5.1 - 5.3 5.2   TCO2 0 - 100 mmol/L - - 24 - -       Capillary Blood Glucose: No results found for: GLUCAP   Exercise Target Goals: Exercise Program Goal: Individual exercise prescription set using results from initial 6 min walk test and THRR while considering  patient's activity barriers and safety.   Exercise Prescription Goal: Initial exercise prescription builds to 30-45 minutes a day of aerobic activity, 2-3 days per week.  Home exercise guidelines will be given to patient during program as part of exercise prescription that the participant will acknowledge.  Activity Barriers & Risk Stratification:  Activity Barriers & Cardiac Risk Stratification - 05/20/21 1002  Activity Barriers & Cardiac Risk Stratification   Activity Barriers Deconditioning;Muscular Weakness;Shortness of Breath;Decreased Ventricular Function;Balance Concerns;Other (comment)    Comments Morbid Obesity    Cardiac Risk Stratification High             6 Minute Walk:  6 Minute Walk     Row Name 05/20/21 1000         6 Minute Walk   Phase Initial     Distance 1181 feet  Used Nustep for test     Walk Time 6 minutes     # of Rest Breaks 0     MPH 2.34     METS 2.98     RPE 8     Perceived Dyspnea  1     VO2 Peak 10.43     Symptoms Yes (comment)     Comments Bilateral hip pain 3/10; SOB RPD = 1      Resting HR 91 bpm     Resting BP 118/78     Resting Oxygen Saturation  98 %     Exercise Oxygen Saturation  during 6 min walk 97 %     Max Ex. HR 94 bpm     Max Ex. BP 124/84     2 Minute Post BP 114/76              Oxygen Initial Assessment:   Oxygen Re-Evaluation:   Oxygen Discharge (Final Oxygen Re-Evaluation):   Initial Exercise Prescription:  Initial Exercise Prescription - 05/20/21 1000       Date of Initial Exercise RX and Referring Provider   Date 05/20/21    Referring Provider Dr Trisha Mangle MD, [facility]/ Armanda Magic, MD  (coverage)    Expected Discharge Date 07/16/21      T5 Nustep   Level 2    SPM 75    Minutes 25    METs 1.8      Prescription Details   Frequency (times per week) 3    Duration Progress to 30 minutes of continuous aerobic without signs/symptoms of physical distress      Intensity   THRR 40-80% of Max Heartrate 81-161    Ratings of Perceived Exertion 11-13    Perceived Dyspnea 0-4      Progression   Progression Continue progressive overload as per policy without signs/symptoms or physical distress.      Resistance Training   Training Prescription Yes    Weight 5 lbs    Reps 10-15             Perform Capillary Blood Glucose checks as needed.  Exercise Prescription Changes:   Exercise Prescription Changes     Row Name 05/24/21 1400 06/07/21 1500           Response to Exercise   Blood Pressure (Admit) 118/60 110/78      Blood Pressure (Exercise) 124/80 122/72      Blood Pressure (Exit) 114/70 118/68      Heart Rate (Admit) 99 bpm 89 bpm      Heart Rate (Exercise) 107 bpm 118 bpm      Heart Rate (Exit) 89 bpm 86 bpm      Rating of Perceived Exertion (Exercise) 13 9      Symptoms Fatigue/Deconditioning Fatigue/ takes breaks      Comments Pt's first day in the CRP2 program Reviewed METs      Duration Continue with 30 min of aerobic exercise without signs/symptoms of physical distress. Progress to 30  minutes of  aerobic without signs/symptoms of physical distress      Intensity THRR unchanged THRR unchanged             Progression   Progression Continue to progress workloads to maintain intensity without signs/symptoms of physical distress. Continue to progress workloads to maintain intensity without signs/symptoms of physical distress.      Average METs 1.8 2.2             Resistance Training   Training Prescription Yes Yes      Weight 5 lbs 5 lbs      Reps 10-15 10-15      Time 10 Minutes 10 Minutes             Interval Training   Interval Training No No             T5 Nustep   Level 2 3      SPM 75 85      Minutes 20 25      METs 1.8 2.2               Exercise Comments:   Exercise Comments     Row Name 05/24/21 1453 06/07/21 1554         Exercise Comments Pt's first day in the CRP2 program. Pt is decondtioned and tolerated only 20 minutes on nustep. No complaints with today's session other than fatigue. Sent stretching packet home with patient and encouraged to try stretching on off days from the program. Reviewed METs. Pt has progressed to level 3 on nustep. Encouraging to progess duration to 30 minutes. Pt requires rest breaks during exercise. Pt is progressing slowly.               Exercise Goals and Review:   Exercise Goals     Row Name 05/20/21 1007             Exercise Goals   Increase Physical Activity Yes       Intervention Provide advice, education, support and counseling about physical activity/exercise needs.;Develop an individualized exercise prescription for aerobic and resistive training based on initial evaluation findings, risk stratification, comorbidities and participant's personal goals.       Expected Outcomes Short Term: Attend rehab on a regular basis to increase amount of physical activity.;Long Term: Add in home exercise to make exercise part of routine and to increase amount of physical activity.;Long Term: Exercising  regularly at least 3-5 days a week.       Increase Strength and Stamina Yes       Intervention Provide advice, education, support and counseling about physical activity/exercise needs.;Develop an individualized exercise prescription for aerobic and resistive training based on initial evaluation findings, risk stratification, comorbidities and participant's personal goals.       Expected Outcomes Short Term: Increase workloads from initial exercise prescription for resistance, speed, and METs.;Short Term: Perform resistance training exercises routinely during rehab and add in resistance training at home;Long Term: Improve cardiorespiratory fitness, muscular endurance and strength as measured by increased METs and functional capacity ( )       Able to understand and use rate of perceived exertion (RPE) scale Yes       Intervention Provide education and explanation on how to use RPE scale       Expected Outcomes Short Term: Able to use RPE daily in rehab to express subjective intensity level;Long Term:  Able to use RPE to guide intensity level when exercising independently  Knowledge and understanding of Target Heart Rate Range (THRR) Yes       Intervention Provide education and explanation of THRR including how the numbers were predicted and where they are located for reference       Expected Outcomes Short Term: Able to state/look up THRR;Short Term: Able to use daily as guideline for intensity in rehab;Long Term: Able to use THRR to govern intensity when exercising independently       Understanding of Exercise Prescription Yes       Intervention Provide education, explanation, and written materials on patient's individual exercise prescription       Expected Outcomes Short Term: Able to explain program exercise prescription;Long Term: Able to explain home exercise prescription to exercise independently                Exercise Goals Re-Evaluation :  Exercise Goals Re-Evaluation     Row  Name 05/24/21 1449             Exercise Goal Re-Evaluation   Exercise Goals Review Increase Physical Activity;Increase Strength and Stamina;Able to understand and use rate of perceived exertion (RPE) scale;Knowledge and understanding of Target Heart Rate Range (THRR);Understanding of Exercise Prescription       Comments Pt's first day in the CRP2 program.  Pt understands the RPE scale, Exercise Rx, and THRR.       Expected Outcomes Will continue to monitor patient and progress exercise workloads as tolerated.                Discharge Exercise Prescription (Final Exercise Prescription Changes):  Exercise Prescription Changes - 06/07/21 1500       Response to Exercise   Blood Pressure (Admit) 110/78    Blood Pressure (Exercise) 122/72    Blood Pressure (Exit) 118/68    Heart Rate (Admit) 89 bpm    Heart Rate (Exercise) 118 bpm    Heart Rate (Exit) 86 bpm    Rating of Perceived Exertion (Exercise) 9    Symptoms Fatigue/ takes breaks    Comments Reviewed METs    Duration Progress to 30 minutes of  aerobic without signs/symptoms of physical distress    Intensity THRR unchanged      Progression   Progression Continue to progress workloads to maintain intensity without signs/symptoms of physical distress.    Average METs 2.2      Resistance Training   Training Prescription Yes    Weight 5 lbs    Reps 10-15    Time 10 Minutes      Interval Training   Interval Training No      T5 Nustep   Level 3    SPM 85    Minutes 25    METs 2.2             Nutrition:  Target Goals: Understanding of nutrition guidelines, daily intake of sodium 1500mg , cholesterol 200mg , calories 30% from fat and 7% or less from saturated fats, daily to have 5 or more servings of fruits and vegetables.  Biometrics:  Pre Biometrics - 05/20/21 0955       Pre Biometrics   Waist Circumference 68 inches    Hip Circumference 66.5 inches    Waist to Hip Ratio 1.02 %    Triceps Skinfold 70 mm     % Body Fat 62.6 %    Grip Strength 35 kg    Flexibility --   Not done   Single Leg Stand 5.61 seconds  Nutrition Therapy Plan and Nutrition Goals:   Nutrition Assessments:  MEDIFICTS Score Key: ?70 Need to make dietary changes  40-70 Heart Healthy Diet ? 40 Therapeutic Level Cholesterol Diet    Picture Your Plate Scores: <69 Unhealthy dietary pattern with much room for improvement. 41-50 Dietary pattern unlikely to meet recommendations for good health and room for improvement. 51-60 More healthful dietary pattern, with some room for improvement.  >60 Healthy dietary pattern, although there may be some specific behaviors that could be improved.    Nutrition Goals Re-Evaluation:   Nutrition Goals Re-Evaluation:   Nutrition Goals Discharge (Final Nutrition Goals Re-Evaluation):   Psychosocial: Target Goals: Acknowledge presence or absence of significant depression and/or stress, maximize coping skills, provide positive support system. Participant is able to verbalize types and ability to use techniques and skills needed for reducing stress and depression.  Initial Review & Psychosocial Screening:  Initial Psych Review & Screening - 05/20/21 6295       Initial Review   Current issues with Current Depression;History of Depression;Current Stress Concerns    Source of Stress Concerns Chronic Illness;Unable to participate in former interests or hobbies;Unable to perform yard/household activities;Transportation    Comments Devin Marshall is unable to participate in many ADL's due to morbid obesity. Devin Marshall graduated from high school in June and would like to enroll in school to learn a trade      [employer]   Good Support System? Yes   Devin Marshall has his parents, and younger brother for suport     Barriers   Psychosocial barriers to participate in program The patient should benefit from training in stress management and relaxation.      Screening  Interventions   Interventions Encouraged to exercise;To provide support and resources with identified psychosocial needs;Provide feedback about the scores to participant    Expected Outcomes Long Term Goal: Stressors or current issues are controlled or eliminated.;Short Term goal: Utilizing psychosocial counselor, staff and physician to assist with identification of specific Stressors or current issues interfering with healing process. Setting desired goal for each stressor or current issue identified.;Short Term goal: Identification and review with participant of any Quality of Life or Depression concerns found by scoring the questionnaire.;Long Term goal: The participant improves quality of Life and PHQ9 Scores as seen by post scores and/or verbalization of changes             Quality of Life Scores:  Quality of Life - 05/20/21 1040       Quality of Life   Select Quality of Life      Quality of Life Scores   Health/Function Pre 17.63 %    Socioeconomic Pre 23.94 %    Psych/Spiritual Pre 27.43 %    Family Pre 15.9 %    GLOBAL Pre 20.79 %            Scores of 19 and below usually indicate a poorer quality of life in these areas.  A difference of  2-3 points is a clinically meaningful difference.  A difference of 2-3 points in the total score of the Quality of Life Index has been associated with significant improvement in overall quality of life, self-image, physical symptoms, and general health in studies assessing change in quality of life.  PHQ-9: Recent Review Flowsheet Data     Depression screen [facility] 2/9 05/27/2021 05/20/2021 08/27/2020 10/12/2017 08/16/2017   Decreased Interest 0 1 0 3 2   Down, Depressed, Hopeless PHQ -  2 Score Altered sleeping Tired, decreased energy Change in appetite 2 2 0 3 2   Feeling bad or failure about yourself  Trouble concentrating Moving slowly or fidgety/restless 0 0 1 0 2    Suicidal thoughts - 0 - 0 0   PHQ-9 Score Difficult doing work/chores - Somewhat difficult - Not difficult at all Somewhat difficult      Interpretation of Total Score  Total Score Depression Severity:  1-4 = Minimal depression, 5-9 = Mild depression, 10-14 = Moderate depression, 15-19 = Moderately severe depression, 20-27 = Severe depression   Psychosocial Evaluation and Intervention:   Psychosocial Re-Evaluation:  Psychosocial Re-Evaluation     Row Name 05/25/21 1422 06/04/21 1723           Psychosocial Re-Evaluation   Current issues with Current Stress Concerns;Current Depression;History of Depression Current Stress Concerns;Current Depression;History of Depression      Comments Reviewed Devin Marshall's quality of life on 05/24/21. Devin Marshall is set up to have virtual counselling through [employer] this week Devin Marshall has not voiced having increased depression since starting phase 2 cardiac rehab. Devin Marshall has started virtual counseling with [facility].      Expected Outcomes Devin Marshall will have controlled or decreased depression upon completion of phase 2 cardiac rehab Devin Marshall will have controlled or decreased depression upon completion of phase 2 cardiac rehab      Interventions Relaxation education;Therapist referral;Encouraged to attend Cardiac Rehabilitation for the exercise Relaxation education;Therapist referral;Encouraged to attend Cardiac Rehabilitation for the exercise      Continue Psychosocial Services  Follow up required by staff Follow up required by staff             Initial Review   Source of Stress Concerns Chronic Illness;Transportation;Unable to perform yard/household activities;Unable to participate in former interests or hobbies Chronic Illness;Transportation;Unable to perform yard/household activities;Unable to participate in former interests or hobbies      Comments Will continue to monitor and offer support as needed. Will continue to  monitor and offer support as needed.               Psychosocial Discharge (Final Psychosocial Re-Evaluation):  Psychosocial Re-Evaluation - 06/04/21 1723       Psychosocial Re-Evaluation   Current issues with Current Stress Concerns;Current Depression;History of Depression    Comments Devin Marshall has not voiced having increased depression since starting phase 2 cardiac rehab. Devin Marshall has started virtual counseling with [facility].    Expected Outcomes Devin Marshall will have controlled or decreased depression upon completion of phase 2 cardiac rehab    Interventions Relaxation education;Therapist referral;Encouraged to attend Cardiac Rehabilitation for the exercise    Continue Psychosocial Services  Follow up required by staff      Initial Review   Source of Stress Concerns Chronic Illness;Transportation;Unable to perform yard/household activities;Unable to participate in former interests or hobbies    Comments Will continue to monitor and offer support as needed.             Vocational Rehabilitation: Provide vocational rehab assistance to qualifying candidates.   Vocational Rehab Evaluation & Intervention:  Vocational Rehab - 05/27/21 1139       Initial Vocational Rehab Evaluation & Intervention   Documents faxed to [facility] Dept of Vocational Rehabilitation 05/27/21  Education: Education Goals: Education classes will be provided on a weekly basis, covering required topics. Participant will state understanding/return demonstration of topics presented.  Learning Barriers/Preferences:  Learning Barriers/Preferences - 05/20/21 0957       Learning Barriers/Preferences   Learning Barriers Sight   wears glasses for reading; H/O ADHD   Learning Preferences Audio;Pictoral;Skilled Demonstration;Computer/Internet;Group Instruction;Verbal Instruction;Individual Instruction;Video;Written Material             Education Topics: Count Your Pulse:  -Group  instruction provided by verbal instruction, demonstration, patient participation and written materials to support subject.  Instructors address importance of being able to find your pulse and how to count your pulse when at home without a heart monitor.  Patients get hands on experience counting their pulse with staff help and individually.   Heart Attack, Angina, and Risk Factor Modification:  -Group instruction provided by verbal instruction, video, and written materials to support subject.  Instructors address signs and symptoms of angina and heart attacks.    Also discuss risk factors for heart disease and how to make changes to improve heart health risk factors.   Functional Fitness:  -Group instruction provided by verbal instruction, demonstration, patient participation, and written materials to support subject.  Instructors address safety measures for doing things around the house.  Discuss how to get up and down off the floor, how to pick things up properly, how to safely get out of a chair without assistance, and balance training.   Meditation and Mindfulness:  -Group instruction provided by verbal instruction, patient participation, and written materials to support subject.  Instructor addresses importance of mindfulness and meditation practice to help reduce stress and improve awareness.  Instructor also leads participants through a meditation exercise.    Stretching for Flexibility and Mobility:  -Group instruction provided by verbal instruction, patient participation, and written materials to support subject.  Instructors lead participants through series of stretches that are designed to increase flexibility thus improving mobility.  These stretches are additional exercise for major muscle groups that are typically performed during regular warm up and cool down.   Hands Only CPR:  -Group verbal, video, and participation provides a basic overview of AHA guidelines for community CPR.  Role-play of emergencies allow participants the opportunity to practice calling for help and chest compression technique with discussion of AED use.   Hypertension: -Group verbal and written instruction that provides a basic overview of hypertension including the most recent diagnostic guidelines, risk factor reduction with self-care instructions and medication management.    Nutrition I class: Heart Healthy Eating:  -Group instruction provided by PowerPoint slides, verbal discussion, and written materials to support subject matter. The instructor gives an explanation and review of the Therapeutic Lifestyle Changes diet recommendations, which includes a discussion on lipid goals, dietary fat, sodium, fiber, plant stanol/sterol esters, sugar, and the components of a well-balanced, healthy diet.   Nutrition II class: Lifestyle Skills:  -Group instruction provided by PowerPoint slides, verbal discussion, and written materials to support subject matter. The instructor gives an explanation and review of label reading, grocery shopping for heart health, heart healthy recipe modifications, and ways to make healthier choices when eating out.   Diabetes Question & Answer:  -Group instruction provided by PowerPoint slides, verbal discussion, and written materials to support subject matter. The instructor gives an explanation and review of diabetes co-morbidities, pre- and post-prandial blood glucose goals, pre-exercise blood glucose goals, signs, symptoms, and treatment of hypoglycemia and hyperglycemia, and foot care basics.   Diabetes  Blitz:  -Group instruction provided by [employer], verbal discussion, and written materials to support subject matter. The instructor gives an explanation and review of the physiology behind type 1 and type 2 diabetes, diabetes medications and rational behind using different medications, pre- and post-prandial blood glucose recommendations and Hemoglobin A1c goals,  diabetes diet, and exercise including blood glucose guidelines for exercising safely.    Portion Distortion:  -Group instruction provided by PowerPoint slides, verbal discussion, written materials, and food models to support subject matter. The instructor gives an explanation of serving size versus portion size, changes in portions sizes over the last 20 years, and what consists of a serving from each food group.   Stress Management:  -Group instruction provided by verbal instruction, video, and written materials to support subject matter.  Instructors review role of stress in heart disease and how to cope with stress positively.     Exercising on Your Own:  -Group instruction provided by verbal instruction, power point, and written materials to support subject.  Instructors discuss benefits of exercise, components of exercise, frequency and intensity of exercise, and end points for exercise.  Also discuss use of nitroglycerin and activating EMS.  Review options of places to exercise outside of rehab.  Review guidelines for sex with heart disease.   Cardiac Drugs I:  -Group instruction provided by verbal instruction and written materials to support subject.  Instructor reviews cardiac drug classes: antiplatelets, anticoagulants, beta blockers, and statins.  Instructor discusses reasons, side effects, and lifestyle considerations for each drug class.   Cardiac Drugs II:  -Group instruction provided by verbal instruction and written materials to support subject.  Instructor reviews cardiac drug classes: angiotensin converting enzyme inhibitors (ACE-I), angiotensin II receptor blockers (ARBs), nitrates, and calcium channel blockers.  Instructor discusses reasons, side effects, and lifestyle considerations for each drug class.   Anatomy and Physiology of the Circulatory System:  Group verbal and written instruction and models provide basic cardiac anatomy and physiology, with the coronary  electrical and arterial systems. Review of: AMI, Angina, Valve disease, Heart Failure, Peripheral Artery Disease, Cardiac Arrhythmia, Pacemakers, and the ICD.   Other Education:  -Group or individual verbal, written, or video instructions that support the educational goals of the cardiac rehab program.   Holiday Eating Survival Tips:  -Group instruction provided by PowerPoint slides, verbal discussion, and written materials to support subject matter. The instructor gives patients tips, tricks, and techniques to help them not only survive but enjoy the holidays despite the onslaught of food that accompanies the holidays.   Knowledge Questionnaire Score:  Knowledge Questionnaire Score - 05/20/21 1041       Knowledge Questionnaire Score   Pre Score 21/24             Core Components/Risk Factors/Patient Goals at Admission:  Personal Goals and Risk Factors at Admission - 05/20/21 0959       Core Components/Risk Factors/Patient Goals on Admission    Weight Management Yes;Obesity;Weight Loss    Intervention Weight Management: Develop a combined nutrition and exercise program designed to reach desired caloric intake, while maintaining appropriate intake of nutrient and fiber, sodium and fats, and appropriate energy expenditure required for the weight goal.;Weight Management: Provide education and appropriate resources to help participant work on and attain dietary goals.;Weight Management/Obesity: Establish reasonable short term and long term weight goals.;Obesity: Provide education and appropriate resources to help participant work on and attain dietary goals.    Admit Weight 438 lb 15 oz (199.1 kg)  Expected Outcomes Understanding of distribution of calorie intake throughout the day with the consumption of 4-5 meals/snacks;Understanding recommendations for meals to include 15-35% energy as protein, 25-35% energy from fat, 35-60% energy from carbohydrates, less than  of dietary  cholesterol, 20-35 gm of total fiber daily;Weight Loss: Understanding of general recommendations for a balanced deficit meal plan, which promotes 1-2 lb weight loss per week and includes a negative energy balance of [phone] kcal/d;Weight Maintenance: Understanding of the daily nutrition guidelines, which includes 25-35% calories from fat, 7% or less cal from saturated fats, less than  cholesterol, less than 1.5gm of sodium, & 5 or more servings of fruits and vegetables daily;Long Term: Adherence to nutrition and physical activity/exercise program aimed toward attainment of established weight goal;Short Term: Continue to assess and modify interventions until short term weight is achieved    Improve shortness of breath with ADL's Yes    Intervention Provide education, individualized exercise plan and daily activity instruction to help decrease symptoms of SOB with activities of daily living.    Expected Outcomes Short Term: Improve cardiorespiratory fitness to achieve a reduction of symptoms when performing ADLs;Long Term: Be able to perform more ADLs without symptoms or delay the onset of symptoms    Heart Failure Yes    Intervention Provide a combined exercise and nutrition program that is supplemented with education, support and counseling about heart failure. Directed toward relieving symptoms such as shortness of breath, decreased exercise tolerance, and extremity edema.    Expected Outcomes Improve functional capacity of life;Short term: Attendance in program 2-3 days a week with increased exercise capacity. Reported lower sodium intake. Reported increased fruit and vegetable intake. Reports medication compliance.;Short term: Daily weights obtained and reported for increase. Utilizing diuretic protocols set by physician.;Long term: Adoption of self-care skills and reduction of barriers for early signs and symptoms recognition and intervention leading to self-care maintenance.    Hypertension Yes     Intervention Provide education on lifestyle modifcations including regular physical activity/exercise, weight management, moderate sodium restriction and increased consumption of fresh fruit, vegetables, and low fat dairy, alcohol moderation, and smoking cessation.;Monitor prescription use compliance.    Expected Outcomes Short Term: Continued assessment and intervention until BP is < 140/3mm HG in hypertensive participants. < 130/74mm HG in hypertensive participants with diabetes, heart failure or chronic kidney disease.;Long Term: Maintenance of blood pressure at goal levels.    Stress Yes    Intervention Offer individual and/or small group education and counseling on adjustment to heart disease, stress management and health-related lifestyle change. Teach and support self-help strategies.;Refer participants experiencing significant psychosocial distress to appropriate mental health specialists for further evaluation and treatment. When possible, include family members and significant others in education/counseling sessions.    Expected Outcomes Short Term: Participant demonstrates changes in health-related behavior, relaxation and other stress management skills, ability to obtain effective social support, and compliance with psychotropic medications if prescribed.;Long Term: Emotional wellbeing is indicated by absence of clinically significant psychosocial distress or social isolation.             Core Components/Risk Factors/Patient Goals Review:   Goals and Risk Factor Review     Row Name 05/25/21 1425 06/04/21 1725           Core Components/Risk Factors/Patient Goals Review   Personal Goals Review Weight Management/Obesity;Heart Failure;Stress Weight Management/Obesity;Heart Failure;Stress      Review Devin Marshall started cardiace rehab on 05/25/21 and did fair for his fitness level. Vital signs were stable Devin Marshall has been  doing well with exercise and continues to rest when needed. Devin Marshall  has lost 4 kg since starting phase 2 cardiac rehab.      Expected Outcomes Zakye will continue to participate in phase 2 cardiac rehab for exercise, nutrition and lifestyle modifications Devin Marshall will continue to participate in phase 2 cardiac rehab for exercise, nutrition and lifestyle modifications               Core Components/Risk Factors/Patient Goals at Discharge (Final Review):   Goals and Risk Factor Review - 06/04/21 1725       Core Components/Risk Factors/Patient Goals Review   Personal Goals Review Weight Management/Obesity;Heart Failure;Stress    Review Devin Marshall has been doing well with exercise and continues to rest when needed. Devin Marshall has lost 4 kg since starting phase 2 cardiac rehab.    Expected Outcomes Devin Marshall will continue to participate in phase 2 cardiac rehab for exercise, nutrition and lifestyle modifications             ITP Comments:  ITP Comments     Row Name 05/20/21 0851 05/25/21 0853 06/04/21 1721       ITP Comments Dr Armanda Magic MD, Medical Director 30 Day ITP Review. Aloysious started cardiac rehab on 05/24/21 and did fair with exercise for his fitness level. Kentravious is deconditioned. 30 Day ITP Review. Ector has good attendance and participation in phase 2 cardiac rehab. Marcella is motivated to develop a healthier lifestyle. Will continue to encourage good habits.              Comments: See ITP comments

## 2021-06-09 ENCOUNTER — Other Ambulatory Visit: Payer: Self-pay

## 2021-06-09 ENCOUNTER — Encounter (HOSPITAL_COMMUNITY)
Admission: RE | Admit: 2021-06-09 | Discharge: 2021-06-09 | Disposition: A | Discharge status: Home / Self Care | Payer: Managed Care, Other (non HMO) | Source: Ambulatory Visit | Attending: Cardiology | Admitting: Cardiology

## 2021-06-09 ENCOUNTER — Ambulatory Visit (HOSPITAL_COMMUNITY): Payer: Managed Care, Other (non HMO)

## 2021-06-09 DIAGNOSIS — I5032 Chronic diastolic (congestive) heart failure: Secondary | ICD-10-CM | POA: Diagnosis not present

## 2021-06-09 DIAGNOSIS — I422 Other hypertrophic cardiomyopathy: Secondary | ICD-10-CM

## 2021-06-11 ENCOUNTER — Encounter (HOSPITAL_COMMUNITY)
Admission: RE | Admit: 2021-06-11 | Discharge: 2021-06-11 | Disposition: A | Discharge status: Home / Self Care | Payer: Managed Care, Other (non HMO) | Source: Ambulatory Visit | Attending: Cardiology | Admitting: Cardiology

## 2021-06-11 ENCOUNTER — Other Ambulatory Visit: Payer: Self-pay

## 2021-06-11 ENCOUNTER — Ambulatory Visit (HOSPITAL_COMMUNITY): Payer: Managed Care, Other (non HMO)

## 2021-06-11 DIAGNOSIS — I5032 Chronic diastolic (congestive) heart failure: Secondary | ICD-10-CM | POA: Diagnosis not present

## 2021-06-11 DIAGNOSIS — I422 Other hypertrophic cardiomyopathy: Secondary | ICD-10-CM

## 2021-06-14 ENCOUNTER — Other Ambulatory Visit: Payer: Self-pay

## 2021-06-14 ENCOUNTER — Ambulatory Visit (HOSPITAL_COMMUNITY): Payer: Managed Care, Other (non HMO)

## 2021-06-14 ENCOUNTER — Encounter (HOSPITAL_COMMUNITY)
Admission: RE | Admit: 2021-06-14 | Discharge: 2021-06-14 | Disposition: A | Discharge status: Home / Self Care | Payer: Managed Care, Other (non HMO) | Source: Ambulatory Visit | Attending: Cardiology | Admitting: Cardiology

## 2021-06-14 DIAGNOSIS — I5032 Chronic diastolic (congestive) heart failure: Secondary | ICD-10-CM | POA: Diagnosis not present

## 2021-06-14 DIAGNOSIS — I422 Other hypertrophic cardiomyopathy: Secondary | ICD-10-CM

## 2021-06-16 ENCOUNTER — Ambulatory Visit (HOSPITAL_COMMUNITY): Payer: Managed Care, Other (non HMO)

## 2021-06-16 ENCOUNTER — Encounter (HOSPITAL_COMMUNITY): Payer: Managed Care, Other (non HMO)

## 2021-06-17 ENCOUNTER — Ambulatory Visit (INDEPENDENT_AMBULATORY_CARE_PROVIDER_SITE_OTHER): Payer: 59 | Admitting: Clinical

## 2021-06-17 DIAGNOSIS — F4323 Adjustment disorder with mixed anxiety and depressed mood: Secondary | ICD-10-CM

## 2021-06-17 NOTE — BH Specialist Note (Signed)
Integrated Behavioral Health via Telemedicine Visit  06/17/2021 Devin Marshall [id number]  11:30 Sent video link to [phone] 11:36 am TC to [phone] & spoke with Devin Marshall who reported it's taking a while to load the video.  Number of Integrated Behavioral Health visits: 3 Session Start time: 11:45 am Session End time: 12:05pm Total time: 20  Referring Provider: Dr. [name] & Cardiac Rehab Team Patient/Family location: Pts home [facility] Provider location: [facility] Office All persons participating in visit: Devin Marshall & Devin Marshall [facility]) Types of Service: Individual psychotherapy and Telephone visit  I connected with Devin Marshall via  Telephone or Video Enabled Telemedicine Application  (Video is Caregility application) and verified that I am speaking with the correct person using two identifiers. Discussed confidentiality: Yes   I discussed the limitations of telemedicine and the availability of in person appointments.  Discussed there is a possibility of technology failure and discussed alternative modes of communication if that failure occurs.  I discussed that engaging in this telemedicine visit, they consent to the provision of behavioral healthcare and the services will be billed under their insurance.  Patient and/or legal guardian expressed understanding and consented to Telemedicine visit: Yes    Presenting Concerns: Patient and/or family reports the following symptoms/concerns: Devin Marshall reported that he's trying to study for his driver's permit so he can complete other goals like get his driver's license in order to have transportation for a job and school in the future - Devin Marshall continues to wear his CPAP to improve his sleep at least 5 nights a week Duration of problem: days to weeks; Severity of problem: mild  Patient and/or Family's Strengths/Protective Factors: Concrete supports in place (healthy food, safe environments, etc.) and Sense of  purpose  Goals Addressed: Patient will:  Increase knowledge and/or ability of: self-management skills - to improve his quality of life.  Demonstrate ability to:  consistently put on CPAP at night time in order to improve his quality of sleep.  Progress towards Goals: Ongoing  Interventions: Interventions utilized:  Motivational Interviewing and Supportive Counseling- Reviewed accomplishments Standardized Assessments completed: Not Needed  Patient and/or Family Response:  Devin Marshall is more motivated to use his CPAP when sleeping since he feels better when he sleeps with it on. Devin Marshall also reported he completed intake forms for [employer]. 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  He also reported feeling better after he's been sleeping with his CPAP.  Devin Marshall is working towards his goals for his health as well as career.    Patient may benefit from continuing to wear his CPAP; engage in ongoing psycho therapy and studying for the written test for his driver's permit.  Plan: Follow up with behavioral health clinician on : No scheduled follow up since pt will be following up with [facility] Behavioral recommendations:  - Continue with his CPAP at night - Study for his driver's permit test - Follow up with [facility] Referral(s): [facility] (LME/Outside Clinic) - [facility] - 05/31/2021 Spoke with Ms. French Ana; will re-send it since they did not receive the completed intake form. TC to [employer] and left a message for him to complete the intake form.  I discussed the assessment and treatment plan with  the patient and/or parent/guardian. They  were provided an opportunity to ask questions and all were answered. They agreed with the plan and demonstrated an understanding of the instructions.   They were advised to call back or seek an in-person evaluation if the symptoms worsen or if the condition fails to improve as anticipated.  Penny Frisbie Ed Blalock, LCSW

## 2021-06-18 ENCOUNTER — Encounter (HOSPITAL_COMMUNITY)
Admission: RE | Admit: 2021-06-18 | Discharge: 2021-06-18 | Disposition: A | Discharge status: Home / Self Care | Payer: Managed Care, Other (non HMO) | Source: Ambulatory Visit | Attending: Cardiology | Admitting: Cardiology

## 2021-06-18 ENCOUNTER — Ambulatory Visit (HOSPITAL_COMMUNITY): Payer: Managed Care, Other (non HMO)

## 2021-06-18 ENCOUNTER — Other Ambulatory Visit: Payer: Self-pay

## 2021-06-18 DIAGNOSIS — I422 Other hypertrophic cardiomyopathy: Secondary | ICD-10-CM

## 2021-06-18 DIAGNOSIS — I5032 Chronic diastolic (congestive) heart failure: Secondary | ICD-10-CM | POA: Diagnosis not present

## 2021-06-21 ENCOUNTER — Ambulatory Visit (HOSPITAL_COMMUNITY): Payer: Managed Care, Other (non HMO)

## 2021-06-21 ENCOUNTER — Encounter (HOSPITAL_COMMUNITY)
Admission: RE | Admit: 2021-06-21 | Discharge: 2021-06-21 | Disposition: A | Discharge status: Home / Self Care | Payer: Managed Care, Other (non HMO) | Source: Ambulatory Visit | Attending: Cardiology | Admitting: Cardiology

## 2021-06-21 ENCOUNTER — Other Ambulatory Visit: Payer: Self-pay

## 2021-06-21 DIAGNOSIS — I422 Other hypertrophic cardiomyopathy: Secondary | ICD-10-CM

## 2021-06-21 DIAGNOSIS — I5032 Chronic diastolic (congestive) heart failure: Secondary | ICD-10-CM | POA: Diagnosis not present

## 2021-06-23 ENCOUNTER — Other Ambulatory Visit: Payer: Self-pay

## 2021-06-23 ENCOUNTER — Ambulatory Visit (HOSPITAL_COMMUNITY): Payer: Managed Care, Other (non HMO)

## 2021-06-23 ENCOUNTER — Encounter (HOSPITAL_COMMUNITY)
Admission: RE | Admit: 2021-06-23 | Discharge: 2021-06-23 | Disposition: A | Discharge status: Home / Self Care | Payer: Managed Care, Other (non HMO) | Source: Ambulatory Visit | Attending: Cardiology | Admitting: Cardiology

## 2021-06-23 DIAGNOSIS — I5032 Chronic diastolic (congestive) heart failure: Secondary | ICD-10-CM | POA: Diagnosis not present

## 2021-06-23 DIAGNOSIS — I422 Other hypertrophic cardiomyopathy: Secondary | ICD-10-CM

## 2021-06-25 ENCOUNTER — Ambulatory Visit (HOSPITAL_COMMUNITY): Payer: Managed Care, Other (non HMO)

## 2021-06-25 ENCOUNTER — Encounter (HOSPITAL_COMMUNITY): Payer: Managed Care, Other (non HMO)

## 2021-06-28 ENCOUNTER — Other Ambulatory Visit: Payer: Self-pay

## 2021-06-28 ENCOUNTER — Encounter (HOSPITAL_COMMUNITY)
Admission: RE | Admit: 2021-06-28 | Discharge: 2021-06-28 | Disposition: A | Discharge status: Home / Self Care | Payer: Managed Care, Other (non HMO) | Source: Ambulatory Visit | Attending: Cardiology | Admitting: Cardiology

## 2021-06-28 ENCOUNTER — Ambulatory Visit (HOSPITAL_COMMUNITY): Payer: Managed Care, Other (non HMO)

## 2021-06-28 DIAGNOSIS — I5032 Chronic diastolic (congestive) heart failure: Secondary | ICD-10-CM | POA: Diagnosis not present

## 2021-06-28 DIAGNOSIS — I422 Other hypertrophic cardiomyopathy: Secondary | ICD-10-CM

## 2021-06-29 NOTE — Progress Notes (Signed)
Cardiac Individual Treatment Plan  Patient Details  Name: Devin Marshall MRN: [id number] Date of Birth: [birth year] Referring Provider:   Flowsheet Row CARDIAC REHAB PHASE II ORIENTATION from 05/20/2021 in [facility] CARDIAC REHAB  Referring Provider Dr Trisha Mangle MD, [facility]/ Armanda Magic, MD  (coverage)       Initial Encounter Date:  Flowsheet Row CARDIAC REHAB PHASE II ORIENTATION from 05/20/2021 in [facility] CARDIAC REHAB  Date 05/20/21       Visit Diagnosis: Hypertrophic cardiomyopathy (HCC)  Patient's Home Medications on Admission:  Current Outpatient Medications:    albuterol (PROVENTIL) (2.5 MG/3ML) 0.083% nebulizer solution, Take 3 mLs (2.5 mg total) by nebulization every 4 (four) hours as needed for wheezing or shortness of breath., Disp: 75 mL, Rfl: 12   albuterol (VENTOLIN HFA) 108 (90 Base) MCG/ACT inhaler, INHALE 2 PUFFS INTO THE LUNGS EVERY 4 HOURS AS NEEDED FOR WHEEZING OR SHORTNESS OF BREATH., Disp: 6.7 each, Rfl: 13   budesonide (PULMICORT) 0.5 MG/2ML nebulizer solution, TAKE 2 MLS (0.5 MG TOTAL) BY NEBULIZATION 2 (TWO) TIMES DAILY. (Patient taking differently: Take 0.5 mg by nebulization 2 (two) times daily as needed (shortness of breath).), Disp: 360 mL, Rfl: 4   fluticasone (FLOVENT HFA) 110 MCG/ACT inhaler, Inhale 2 puffs into the lungs 2 (two) times daily. (Patient not taking: Reported on 05/20/2021), Disp: 1 Inhaler, Rfl: 12   metoprolol succinate (TOPROL-XL) 50 MG 24 hr tablet, Take 50 mg by mouth daily., Disp: , Rfl:    pantoprazole (PROTONIX) 40 MG tablet, Take 40 mg by mouth daily., Disp: , Rfl:   Past Medical History: Past Medical History:  Diagnosis Date   ADHD (attention deficit hyperactivity disorder)    Allergy    Anxiety    Asthma    Asthma    [employer]reesia 01/28/2020   Conjunctivitis 05/08/2013   Depression    Eating disorder    Eczema    Morbid obesity (HCC)    Obesity    Situational anxiety    Sleep  apnea    Undiagnosed cardiac murmurs 12/10/2012    Tobacco Use: Social History   Tobacco Use  Smoking Status Never   Passive exposure: Yes  Smokeless Tobacco Never  Tobacco Comments   both parents smoke    Labs: Recent Review Flowsheet Data     Labs for ITP Cardiac and Pulmonary Rehab Latest Ref Rng & Units 07/30/2013 01/29/2014 08/05/2014 06/18/2015 10/12/2017   Cholestrol 100 - 169 mg/dL - - - 409 811(B)   LDLCALC 0 - 109 mg/dL - - - 147(W) 295(A)   HDL >39 mg/dL - - - 21(H) 08(M)   Trlycerides 0 - 89 mg/dL - - - 578 72   Hemoglobin A1c 4.8 - 5.6 % 5.1 5.1 - 5.3 5.2   TCO2 0 - 100 mmol/L - - 24 - -       Capillary Blood Glucose: No results found for: GLUCAP   Exercise Target Goals: Exercise Program Goal: Individual exercise prescription set using results from initial 6 min walk test and THRR while considering  patient's activity barriers and safety.   Exercise Prescription Goal: Starting with aerobic activity 30 plus minutes a day, 3 days per week for initial exercise prescription. Provide home exercise prescription and guidelines that participant acknowledges understanding prior to discharge.  Activity Barriers & Risk Stratification:  Activity Barriers & Cardiac Risk Stratification - 05/20/21 1002       Activity Barriers & Cardiac Risk Stratification   Activity  Barriers Deconditioning;Muscular Weakness;Shortness of Breath;Decreased Ventricular Function;Balance Concerns;Other (comment)    Comments Morbid Obesity    Cardiac Risk Stratification High             6 Minute Walk:  6 Minute Walk     Row Name 05/20/21 1000         6 Minute Walk   Phase Initial     Distance 1181 feet  Used Nustep for test     Walk Time 6 minutes     # of Rest Breaks 0     MPH 2.34     METS 2.98     RPE 8     Perceived Dyspnea  1     VO2 Peak 10.43     Symptoms Yes (comment)     Comments Bilateral hip pain 3/10; SOB RPD = 1     Resting HR 91 bpm     Resting BP 118/78      Resting Oxygen Saturation  98 %     Exercise Oxygen Saturation  during 6 min walk 97 %     Max Ex. HR 94 bpm     Max Ex. BP 124/84     2 Minute Post BP 114/76              Oxygen Initial Assessment:   Oxygen Re-Evaluation:   Oxygen Discharge (Final Oxygen Re-Evaluation):   Initial Exercise Prescription:  Initial Exercise Prescription - 05/20/21 1000       Date of Initial Exercise RX and Referring Provider   Date 05/20/21    Referring Provider Dr Trisha Mangle MD, [facility]/ Armanda Magic, MD  (coverage)    Expected Discharge Date 07/16/21      T5 Nustep   Level 2    SPM 75    Minutes 25    METs 1.8      Prescription Details   Frequency (times per week) 3    Duration Progress to 30 minutes of continuous aerobic without signs/symptoms of physical distress      Intensity   THRR 40-80% of Max Heartrate 81-161    Ratings of Perceived Exertion 11-13    Perceived Dyspnea 0-4      Progression   Progression Continue progressive overload as per policy without signs/symptoms or physical distress.      Resistance Training   Training Prescription Yes    Weight 5 lbs    Reps 10-15             Perform Capillary Blood Glucose checks as needed.  Exercise Prescription Changes:   Exercise Prescription Changes     Row Name 05/24/21 1400 06/07/21 1500 06/18/21 1500         Response to Exercise   Blood Pressure (Admit) 118/60 110/78 112/80     Blood Pressure (Exercise) 124/80 122/72 118/70     Blood Pressure (Exit) 114/70 118/68 120/72     Heart Rate (Admit) 99 bpm 89 bpm 85 bpm     Heart Rate (Exercise) 107 bpm 118 bpm 103 bpm     Heart Rate (Exit) 89 bpm 86 bpm 85 bpm     Rating of Perceived Exertion (Exercise) 13 9 11      Symptoms Fatigue/Deconditioning Fatigue/ takes breaks Fatigue/ takes breaks     Comments Pt's first day in the CRP2 program Reviewed METs Reviewed METs and Goals     Duration Continue with 30 min of aerobic exercise without  signs/symptoms of physical distress. Progress to 30  minutes of  aerobic without signs/symptoms of physical distress Progress to 30 minutes of  aerobic without signs/symptoms of physical distress     Intensity THRR unchanged THRR unchanged THRR unchanged       Progression   Progression Continue to progress workloads to maintain intensity without signs/symptoms of physical distress. Continue to progress workloads to maintain intensity without signs/symptoms of physical distress. Continue to progress workloads to maintain intensity without signs/symptoms of physical distress.     Average METs 1.8 2.2 2.2       Resistance Training   Training Prescription Yes Yes Yes     Weight 5 lbs 5 lbs 5 lbs     Reps 10-15 10-15 10-15     Time 10 Minutes 10 Minutes 10 Minutes       Interval Training   Interval Training No No No       T5 Nustep   Level SPM 75 85 85     Minutes METs 1.8 2.2 2.2              Exercise Comments:   Exercise Comments     Row Name 05/24/21 1453 06/07/21 1554 06/18/21 1619       Exercise Comments Pt's first day in the CRP2 program. Pt is decondtioned and tolerated only 20 minutes on nustep. No complaints with today's session other than fatigue. Sent stretching packet home with patient and encouraged to try stretching on off days from the program. Reviewed METs. Pt has progressed to level 3 on nustep. Encouraging to progess duration to 30 minutes. Pt requires rest breaks during exercise. Pt is progressing slowly. Reviewed METs and Goals. Pt is feeling better, walking more at home and has lost 6.1 kg since beginning the program.              Exercise Goals and Review:   Exercise Goals     Row Name 05/20/21 1007             Exercise Goals   Increase Physical Activity Yes       Intervention Provide advice, education, support and counseling about physical activity/exercise needs.;Develop an individualized exercise prescription for  aerobic and resistive training based on initial evaluation findings, risk stratification, comorbidities and participant's personal goals.       Expected Outcomes Short Term: Attend rehab on a regular basis to increase amount of physical activity.;Long Term: Add in home exercise to make exercise part of routine and to increase amount of physical activity.;Long Term: Exercising regularly at least 3-5 days a week.       Increase Strength and Stamina Yes       Intervention Provide advice, education, support and counseling about physical activity/exercise needs.;Develop an individualized exercise prescription for aerobic and resistive training based on initial evaluation findings, risk stratification, comorbidities and participant's personal goals.       Expected Outcomes Short Term: Increase workloads from initial exercise prescription for resistance, speed, and METs.;Short Term: Perform resistance training exercises routinely during rehab and add in resistance training at home;Long Term: Improve cardiorespiratory fitness, muscular endurance and strength as measured by increased METs and functional capacity ( )       Able to understand and use rate of perceived exertion (RPE) scale Yes       Intervention Provide education and explanation on how to use RPE scale       Expected Outcomes Short Term: Able to  use RPE daily in rehab to express subjective intensity level;Long Term:  Able to use RPE to guide intensity level when exercising independently       Knowledge and understanding of Target Heart Rate Range (THRR) Yes       Intervention Provide education and explanation of THRR including how the numbers were predicted and where they are located for reference       Expected Outcomes Short Term: Able to state/look up THRR;Short Term: Able to use daily as guideline for intensity in rehab;Long Term: Able to use THRR to govern intensity when exercising independently       Understanding of Exercise Prescription  Yes       Intervention Provide education, explanation, and written materials on patient's individual exercise prescription       Expected Outcomes Short Term: Able to explain program exercise prescription;Long Term: Able to explain home exercise prescription to exercise independently                Exercise Goals Re-Evaluation :  Exercise Goals Re-Evaluation     Row Name 05/24/21 1449 06/18/21 1500           Exercise Goal Re-Evaluation   Exercise Goals Review Increase Physical Activity;Increase Strength and Stamina;Able to understand and use rate of perceived exertion (RPE) scale;Knowledge and understanding of Target Heart Rate Range (THRR);Understanding of Exercise Prescription Increase Physical Activity;Increase Strength and Stamina;Able to understand and use rate of perceived exertion (RPE) scale;Knowledge and understanding of Target Heart Rate Range (THRR);Able to check pulse independently;Understanding of Exercise Prescription      Comments Pt's first day in the CRP2 program.  Pt understands the RPE scale, Exercise Rx, and THRR. Reviewed METs and Goals. Pt is progressing toward goals. He      Expected Outcomes Will continue to monitor patient and progress exercise workloads as tolerated. --                Discharge Exercise Prescription (Final Exercise Prescription Changes):  Exercise Prescription Changes - 06/18/21 1500       Response to Exercise   Blood Pressure (Admit) 112/80    Blood Pressure (Exercise) 118/70    Blood Pressure (Exit) 120/72    Heart Rate (Admit) 85 bpm    Heart Rate (Exercise) 103 bpm    Heart Rate (Exit) 85 bpm    Rating of Perceived Exertion (Exercise) 11    Symptoms Fatigue/ takes breaks    Comments Reviewed METs and Goals    Duration Progress to 30 minutes of  aerobic without signs/symptoms of physical distress    Intensity THRR unchanged      Progression   Progression Continue to progress workloads to maintain intensity without  signs/symptoms of physical distress.    Average METs 2.2      Resistance Training   Training Prescription Yes    Weight 5 lbs    Reps 10-15    Time 10 Minutes      Interval Training   Interval Training No      T5 Nustep   Level 3    SPM 85    Minutes 25    METs 2.2             Nutrition:  Target Goals: Understanding of nutrition guidelines, daily intake of sodium 1500mg , cholesterol 200mg , calories 30% from fat and 7% or less from saturated fats, daily to have 5 or more servings of fruits and vegetables.  Biometrics:  Pre Biometrics - 05/20/21 0932  Pre Biometrics   Waist Circumference 68 inches    Hip Circumference 66.5 inches    Waist to Hip Ratio 1.02 %    Triceps Skinfold 70 mm    % Body Fat 62.6 %    Grip Strength 35 kg    Flexibility --   Not done   Single Leg Stand 5.61 seconds              Nutrition Therapy Plan and Nutrition Goals:   Nutrition Assessments:  MEDIFICTS Score Key: ?70 Need to make dietary changes  40-70 Heart Healthy Diet ? 40 Therapeutic Level Cholesterol Diet   Picture Your Plate Scores: <16 Unhealthy dietary pattern with much room for improvement. 41-50 Dietary pattern unlikely to meet recommendations for good health and room for improvement. 51-60 More healthful dietary pattern, with some room for improvement.  >60 Healthy dietary pattern, although there may be some specific behaviors that could be improved.    Nutrition Goals Re-Evaluation:   Nutrition Goals Discharge (Final Nutrition Goals Re-Evaluation):   Psychosocial: Target Goals: Acknowledge presence or absence of significant depression and/or stress, maximize coping skills, provide positive support system. Participant is able to verbalize types and ability to use techniques and skills needed for reducing stress and depression.  Initial Review & Psychosocial Screening:  Initial Psych Review & Screening - 05/20/21 1096       Initial Review    Current issues with Current Depression;History of Depression;Current Stress Concerns    Source of Stress Concerns Chronic Illness;Unable to participate in former interests or hobbies;Unable to perform yard/household activities;Transportation    Comments Tyshon is unable to participate in many ADL's due to morbid obesity. Kyrollos graduated from high school in June and would like to enroll in school to learn a trade      [employer]   Good Support System? Yes   Tyheim has his parents, and younger brother for suport     Barriers   Psychosocial barriers to participate in program The patient should benefit from training in stress management and relaxation.      Screening Interventions   Interventions Encouraged to exercise;To provide support and resources with identified psychosocial needs;Provide feedback about the scores to participant    Expected Outcomes Long Term Goal: Stressors or current issues are controlled or eliminated.;Short Term goal: Utilizing psychosocial counselor, staff and physician to assist with identification of specific Stressors or current issues interfering with healing process. Setting desired goal for each stressor or current issue identified.;Short Term goal: Identification and review with participant of any Quality of Life or Depression concerns found by scoring the questionnaire.;Long Term goal: The participant improves quality of Life and PHQ9 Scores as seen by post scores and/or verbalization of changes             Quality of Life Scores:  Quality of Life - 05/20/21 1040       Quality of Life   Select Quality of Life      Quality of Life Scores   Health/Function Pre 17.63 %    Socioeconomic Pre 23.94 %    Psych/Spiritual Pre 27.43 %    Family Pre 15.9 %    GLOBAL Pre 20.79 %            Scores of 19 and below usually indicate a poorer quality of life in these areas.  A difference of  2-3 points is a clinically meaningful difference.  A difference  of 2-3 points in the total score of the  Quality of Life Index has been associated with significant improvement in overall quality of life, self-image, physical symptoms, and general health in studies assessing change in quality of life.  PHQ-9: Recent Review Flowsheet Data     Depression screen [facility] 2/9 05/27/2021 05/20/2021 08/27/2020 10/12/2017 08/16/2017   Decreased Interest 0 1 0 3 2   Down, Depressed, Hopeless PHQ - 2 Score Altered sleeping Tired, decreased energy Change in appetite 2 2 0 3 2   Feeling bad or failure about yourself  Trouble concentrating Moving slowly or fidgety/restless 0 0 1 0 2   Suicidal thoughts - 0 - 0 0   PHQ-9 Score Difficult doing work/chores - Somewhat difficult - Not difficult at all Somewhat difficult      Interpretation of Total Score  Total Score Depression Severity:  1-4 = Minimal depression, 5-9 = Mild depression, 10-14 = Moderate depression, 15-19 = Moderately severe depression, 20-27 = Severe depression   Psychosocial Evaluation and Intervention:   Psychosocial Re-Evaluation:  Psychosocial Re-Evaluation     Row Name 05/25/21 1422 06/04/21 1723 06/29/21 1553         Psychosocial Re-Evaluation   Current issues with Current Stress Concerns;Current Depression;History of Depression Current Stress Concerns;Current Depression;History of Depression Current Stress Concerns;Current Depression;History of Depression     Comments Reviewed Chevy's quality of life on 05/24/21. Rollen is set up to have virtual counselling through [employer] this week Alonte has not voiced having increased depression since starting phase 2 cardiac rehab. Timmie has started virtual counseling with [facility]. Bailee has not voiced having increased depression since starting phase 2 cardiac rehab. Will follow up about [place] filling out paperwork for counselling.      Expected Outcomes Torrance will have controlled or decreased depression upon completion of phase 2 cardiac rehab Li will have controlled or decreased depression upon completion of phase 2 cardiac rehab Hosam will have controlled or decreased depression upon completion of phase 2 cardiac rehab     Interventions Relaxation education;Therapist referral;Encouraged to attend Cardiac Rehabilitation for the exercise Relaxation education;Therapist referral;Encouraged to attend Cardiac Rehabilitation for the exercise Relaxation education;Therapist referral;Encouraged to attend Cardiac Rehabilitation for the exercise     Continue Psychosocial Services  Follow up required by staff Follow up required by staff Follow up required by staff       Initial Review   Source of Stress Concerns Chronic Illness;Transportation;Unable to perform yard/household activities;Unable to participate in former interests or hobbies Chronic Illness;Transportation;Unable to perform yard/household activities;Unable to participate in former interests or hobbies Chronic Illness;Transportation;Unable to perform yard/household activities;Unable to participate in former interests or hobbies     Comments Will continue to monitor and offer support as needed. Will continue to monitor and offer support as needed. Will continue to monitor and offer support as needed.              Psychosocial Discharge (Final Psychosocial Re-Evaluation):  Psychosocial Re-Evaluation - 06/29/21 1553       Psychosocial Re-Evaluation   Current issues with Current Stress Concerns;Current Depression;History of Depression    Comments Daimian has not voiced having increased depression since starting phase 2 cardiac rehab. Will follow up about Kiing filling out paperwork for counselling.  Expected Outcomes Jasmon will have controlled or decreased depression upon completion of phase 2 cardiac rehab    Interventions Relaxation education;Therapist  referral;Encouraged to attend Cardiac Rehabilitation for the exercise    Continue Psychosocial Services  Follow up required by staff      Initial Review   Source of Stress Concerns Chronic Illness;Transportation;Unable to perform yard/household activities;Unable to participate in former interests or hobbies    Comments Will continue to monitor and offer support as needed.             Vocational Rehabilitation: Provide vocational rehab assistance to qualifying candidates.   Vocational Rehab Evaluation & Intervention:  Vocational Rehab - 05/27/21 1139       Initial Vocational Rehab Evaluation & Intervention   Documents faxed to [place] Dept of Vocational Rehabilitation 05/27/21             Education: Education Goals: Education classes will be provided on a weekly basis, covering required topics. Participant will state understanding/return demonstration of topics presented.  Learning Barriers/Preferences:  Learning Barriers/Preferences - 05/20/21 0957       Learning Barriers/Preferences   Learning Barriers Sight   wears glasses for reading; H/O ADHD   Learning Preferences Audio;Pictoral;Skilled Demonstration;Computer/Internet;Group Instruction;Verbal Instruction;Individual Instruction;Video;Written Material             Education Topics: Hypertension, Hypertension Reduction -Define heart disease and high blood pressure. Discus how high blood pressure affects the body and ways to reduce high blood pressure.   Exercise and Your Heart -Discuss why it is important to exercise, the FITT principles of exercise, normal and abnormal responses to exercise, and how to exercise safely.   Angina -Discuss definition of angina, causes of angina, treatment of angina, and how to decrease risk of having angina.   Cardiac Medications -Review what the following cardiac medications are used for, how they affect the body, and side effects that may occur when taking the medications.   Medications include Aspirin, Beta blockers, calcium channel blockers, ACE Inhibitors, angiotensin receptor blockers, diuretics, digoxin, and antihyperlipidemics.   Congestive Heart Failure -Discuss the definition of CHF, how to live with CHF, the signs and symptoms of CHF, and how keep track of weight and sodium intake.   Heart Disease and Intimacy -Discus the effect sexual activity has on the heart, how changes occur during intimacy as we age, and safety during sexual activity.   Smoking Cessation / COPD -Discuss different methods to quit smoking, the health benefits of quitting smoking, and the definition of COPD.   Nutrition I: Fats -Discuss the types of cholesterol, what cholesterol does to the heart, and how cholesterol levels can be controlled.   Nutrition II: Labels -Discuss the different components of food labels and how to read food label   Heart Parts/Heart Disease and PAD -Discuss the anatomy of the heart, the pathway of blood circulation through the heart, and these are affected by heart disease.   Stress I: Signs and Symptoms -Discuss the causes of stress, how stress may lead to anxiety and depression, and ways to limit stress.   Stress II: Relaxation -Discuss different types of relaxation techniques to limit stress.   Warning Signs of Stroke / TIA -Discuss definition of a stroke, what the signs and symptoms are of a stroke, and how to identify when someone is having stroke.   Knowledge Questionnaire Score:  Knowledge Questionnaire Score - 05/20/21 1041       Knowledge Questionnaire Score   Pre Score 21/24  Core Components/Risk Factors/Patient Goals at Admission:  Personal Goals and Risk Factors at Admission - 05/20/21 0959       Core Components/Risk Factors/Patient Goals on Admission    Weight Management Yes;Obesity;Weight Loss    Intervention Weight Management: Develop a combined nutrition and exercise program designed to reach  desired caloric intake, while maintaining appropriate intake of nutrient and fiber, sodium and fats, and appropriate energy expenditure required for the weight goal.;Weight Management: Provide education and appropriate resources to help participant work on and attain dietary goals.;Weight Management/Obesity: Establish reasonable short term and long term weight goals.;Obesity: Provide education and appropriate resources to help participant work on and attain dietary goals.    Admit Weight 438 lb 15 oz (199.1 kg)    Expected Outcomes Understanding of distribution of calorie intake throughout the day with the consumption of 4-5 meals/snacks;Understanding recommendations for meals to include 15-35% energy as protein, 25-35% energy from fat, 35-60% energy from carbohydrates, less than  of dietary cholesterol, 20-35 gm of total fiber daily;Weight Loss: Understanding of general recommendations for a balanced deficit meal plan, which promotes 1-2 lb weight loss per week and includes a negative energy balance of [phone] kcal/d;Weight Maintenance: Understanding of the daily nutrition guidelines, which includes 25-35% calories from fat, 7% or less cal from saturated fats, less than  cholesterol, less than 1.5gm of sodium, & 5 or more servings of fruits and vegetables daily;Long Term: Adherence to nutrition and physical activity/exercise program aimed toward attainment of established weight goal;Short Term: Continue to assess and modify interventions until short term weight is achieved    Improve shortness of breath with ADL's Yes    Intervention Provide education, individualized exercise plan and daily activity instruction to help decrease symptoms of SOB with activities of daily living.    Expected Outcomes Short Term: Improve cardiorespiratory fitness to achieve a reduction of symptoms when performing ADLs;Long Term: Be able to perform more ADLs without symptoms or delay the onset of symptoms    Heart  Failure Yes    Intervention Provide a combined exercise and nutrition program that is supplemented with education, support and counseling about heart failure. Directed toward relieving symptoms such as shortness of breath, decreased exercise tolerance, and extremity edema.    Expected Outcomes Improve functional capacity of life;Short term: Attendance in program 2-3 days a week with increased exercise capacity. Reported lower sodium intake. Reported increased fruit and vegetable intake. Reports medication compliance.;Short term: Daily weights obtained and reported for increase. Utilizing diuretic protocols set by physician.;Long term: Adoption of self-care skills and reduction of barriers for early signs and symptoms recognition and intervention leading to self-care maintenance.    Hypertension Yes    Intervention Provide education on lifestyle modifcations including regular physical activity/exercise, weight management, moderate sodium restriction and increased consumption of fresh fruit, vegetables, and low fat dairy, alcohol moderation, and smoking cessation.;Monitor prescription use compliance.    Expected Outcomes Short Term: Continued assessment and intervention until BP is < 140/5mm HG in hypertensive participants. < 130/83mm HG in hypertensive participants with diabetes, heart failure or chronic kidney disease.;Long Term: Maintenance of blood pressure at goal levels.    Stress Yes    Intervention Offer individual and/or small group education and counseling on adjustment to heart disease, stress management and health-related lifestyle change. Teach and support self-help strategies.;Refer participants experiencing significant psychosocial distress to appropriate mental health specialists for further evaluation and treatment. When possible, include family members and significant others in education/counseling sessions.    Expected Outcomes  Short Term: Participant demonstrates changes in health-related  behavior, relaxation and other stress management skills, ability to obtain effective social support, and compliance with psychotropic medications if prescribed.;Long Term: Emotional wellbeing is indicated by absence of clinically significant psychosocial distress or social isolation.             Core Components/Risk Factors/Patient Goals Review:   Goals and Risk Factor Review     Row Name 05/25/21 1425 06/04/21 1725 06/29/21 1558         Core Components/Risk Factors/Patient Goals Review   Personal Goals Review Weight Management/Obesity;Heart Failure;Stress Weight Management/Obesity;Heart Failure;Stress Weight Management/Obesity;Heart Failure;Stress     Review Sreeram started cardiace rehab on 05/25/21 and did fair for his fitness level. Vital signs were stable Howell has been doing well with exercise and continues to rest when needed. Taymar has lost 4 kg since starting phase 2 cardiac rehab. Jayen has been doing well with exercise and continues to rest when needed. Konan continues to weigh less than when he started CR. Vital signs have been stable.     Expected Outcomes Jerad will continue to participate in phase 2 cardiac rehab for exercise, nutrition and lifestyle modifications Octavio will continue to participate in phase 2 cardiac rehab for exercise, nutrition and lifestyle modifications Theodoros will continue to participate in phase 2 cardiac rehab for exercise, nutrition and lifestyle modifications              Core Components/Risk Factors/Patient Goals at Discharge (Final Review):   Goals and Risk Factor Review - 06/29/21 1558       Core Components/Risk Factors/Patient Goals Review   Personal Goals Review Weight Management/Obesity;Heart Failure;Stress    Review Dima has been doing well with exercise and continues to rest when needed. Favian continues to weigh less than when he started CR. Vital signs have been stable.    Expected Outcomes Cheveyo will continue to  participate in phase 2 cardiac rehab for exercise, nutrition and lifestyle modifications             ITP Comments:  ITP Comments     Row Name 05/20/21 0851 05/25/21 0853 06/04/21 1721 06/29/21 1552     ITP Comments Dr Armanda Magic MD, Medical Director 30 Day ITP Review. Mikaiah started cardiac rehab on 05/24/21 and did fair with exercise for his fitness level. Melbin is deconditioned. 30 Day ITP Review. Flory has good attendance and participation in phase 2 cardiac rehab. Sandra is motivated to develop a healthier lifestyle. Will continue to encourage good habits. 30 Day ITP Review. Mekell has good attendance and participation in phase 2 cardiac rehab. Swen is motivated to develop a healthier lifestyle. Will continue to encourage good habits. Caysin will complete phase 2 cardiac rehab on 07/16/21.             Comments: See ITP comments.Gladstone Lighter, RN,BSN 06/29/2021 4:01 PM

## 2021-06-30 ENCOUNTER — Ambulatory Visit (HOSPITAL_COMMUNITY): Payer: Managed Care, Other (non HMO)

## 2021-06-30 ENCOUNTER — Encounter (HOSPITAL_COMMUNITY)
Admission: RE | Admit: 2021-06-30 | Discharge: 2021-06-30 | Disposition: A | Discharge status: Home / Self Care | Payer: Managed Care, Other (non HMO) | Source: Ambulatory Visit | Attending: Cardiology | Admitting: Cardiology

## 2021-06-30 ENCOUNTER — Other Ambulatory Visit: Payer: Self-pay

## 2021-06-30 DIAGNOSIS — I422 Other hypertrophic cardiomyopathy: Secondary | ICD-10-CM | POA: Insufficient documentation

## 2021-07-01 NOTE — Progress Notes (Signed)
Reviewed home exercise Rx with patient today. Encouraged to warm-up, cool-down and stretch. Reviewed THRR of 81-161 and keeping RPE between 11-13. Encouraged hydration and that patient carry a cell phone when walking outside. Reviewed weather parameters for temperature and humidity for safe exercise outdoors. Reviewed S/S to terminate exercise and when to cal MD vs 911. Pt verbalized understanding of the home exercise Rx and was provided a copy.   Lorin Picket MS, ACSM-CEP, CCRP

## 2021-07-02 ENCOUNTER — Encounter (HOSPITAL_COMMUNITY)
Admission: RE | Admit: 2021-07-02 | Discharge: 2021-07-02 | Disposition: A | Discharge status: Home / Self Care | Payer: Managed Care, Other (non HMO) | Source: Ambulatory Visit | Attending: Cardiology | Admitting: Cardiology

## 2021-07-02 ENCOUNTER — Other Ambulatory Visit: Payer: Self-pay

## 2021-07-02 ENCOUNTER — Ambulatory Visit (HOSPITAL_COMMUNITY): Payer: Managed Care, Other (non HMO)

## 2021-07-02 DIAGNOSIS — I422 Other hypertrophic cardiomyopathy: Secondary | ICD-10-CM

## 2021-07-05 ENCOUNTER — Ambulatory Visit (HOSPITAL_COMMUNITY): Payer: Managed Care, Other (non HMO)

## 2021-07-05 ENCOUNTER — Encounter (HOSPITAL_COMMUNITY): Payer: Managed Care, Other (non HMO)

## 2021-07-07 ENCOUNTER — Encounter (HOSPITAL_COMMUNITY)
Admission: RE | Admit: 2021-07-07 | Discharge: 2021-07-07 | Disposition: A | Discharge status: Home / Self Care | Payer: Managed Care, Other (non HMO) | Source: Ambulatory Visit | Attending: Cardiology | Admitting: Cardiology

## 2021-07-07 ENCOUNTER — Ambulatory Visit (HOSPITAL_COMMUNITY): Payer: Managed Care, Other (non HMO)

## 2021-07-07 ENCOUNTER — Other Ambulatory Visit: Payer: Self-pay

## 2021-07-07 DIAGNOSIS — I422 Other hypertrophic cardiomyopathy: Secondary | ICD-10-CM | POA: Diagnosis not present

## 2021-07-09 ENCOUNTER — Encounter (HOSPITAL_COMMUNITY)
Admission: RE | Admit: 2021-07-09 | Discharge: 2021-07-09 | Disposition: A | Discharge status: Home / Self Care | Payer: Managed Care, Other (non HMO) | Source: Ambulatory Visit | Attending: Cardiology | Admitting: Cardiology

## 2021-07-09 ENCOUNTER — Other Ambulatory Visit: Payer: Self-pay

## 2021-07-09 ENCOUNTER — Ambulatory Visit (HOSPITAL_COMMUNITY): Payer: Managed Care, Other (non HMO)

## 2021-07-09 DIAGNOSIS — I422 Other hypertrophic cardiomyopathy: Secondary | ICD-10-CM | POA: Diagnosis not present

## 2021-07-12 ENCOUNTER — Encounter (HOSPITAL_COMMUNITY): Payer: Managed Care, Other (non HMO)

## 2021-07-12 ENCOUNTER — Other Ambulatory Visit: Payer: Self-pay

## 2021-07-12 ENCOUNTER — Encounter (HOSPITAL_COMMUNITY)
Admission: RE | Admit: 2021-07-12 | Discharge: 2021-07-12 | Disposition: A | Discharge status: Home / Self Care | Payer: Managed Care, Other (non HMO) | Source: Ambulatory Visit | Attending: Cardiology | Admitting: Cardiology

## 2021-07-12 ENCOUNTER — Ambulatory Visit (HOSPITAL_COMMUNITY): Payer: Managed Care, Other (non HMO)

## 2021-07-12 DIAGNOSIS — I422 Other hypertrophic cardiomyopathy: Secondary | ICD-10-CM | POA: Diagnosis not present

## 2021-07-14 ENCOUNTER — Other Ambulatory Visit: Payer: Self-pay

## 2021-07-14 ENCOUNTER — Encounter (HOSPITAL_COMMUNITY)
Admission: RE | Admit: 2021-07-14 | Discharge: 2021-07-14 | Disposition: A | Discharge status: Home / Self Care | Payer: Managed Care, Other (non HMO) | Source: Ambulatory Visit | Attending: Cardiology | Admitting: Cardiology

## 2021-07-14 ENCOUNTER — Encounter (HOSPITAL_COMMUNITY): Payer: Managed Care, Other (non HMO)

## 2021-07-14 ENCOUNTER — Ambulatory Visit (HOSPITAL_COMMUNITY): Payer: Managed Care, Other (non HMO)

## 2021-07-14 DIAGNOSIS — I422 Other hypertrophic cardiomyopathy: Secondary | ICD-10-CM

## 2021-07-14 NOTE — Progress Notes (Signed)
Discharge Progress Report  Patient Details  Name: Devin Marshall MRN: [id number] Date of Birth: [birth year] Referring Provider:   Flowsheet Row CARDIAC REHAB PHASE II ORIENTATION from 05/20/2021 in [place]  Referring Provider Dr Gaylyn Cheers MD, [facility]/ Fransico Him, MD  (coverage)        Number of Visits: 20  Reason for Discharge:  Patient reached a stable level of exercise. Patient has met program and personal goals.  Smoking History:  Social History   Tobacco Use  Smoking Status Never   Passive exposure: Yes  Smokeless Tobacco Never  Tobacco Comments   both parents smoke    Diagnosis:  Hypertrophic cardiomyopathy ([place])  ADL [facility]SD:   Initial Exercise Prescription:  Initial Exercise Prescription - 05/20/21 1000       Date of Initial Exercise RX and Referring Provider   Date 05/20/21    Referring Provider Dr Gaylyn Cheers MD, [facility]/ Fransico Him, MD  (coverage)    Expected Discharge Date 07/16/21      T5 Nustep   Level 2    SPM 75    Minutes 25    METs 1.8      Prescription Details   Frequency (times per week) 3    Duration Progress to 30 minutes of continuous aerobic without signs/symptoms of physical distress      Intensity   THRR 40-80% of Max Heartrate 81-161    Ratings of Perceived Exertion 11-13    Perceived Dyspnea 0-4      Progression   Progression Continue progressive overload as per policy without signs/symptoms or physical distress.      Resistance Training   Training Prescription Yes    Weight 5 lbs    Reps 10-15             Discharge Exercise Prescription (Final Exercise Prescription Changes):  Exercise Prescription Changes - 06/30/21 1443       Response to Exercise   Blood Pressure (Admit) 100/66    Blood Pressure (Exercise) 118/70    Blood Pressure (Exit) 118/70    Heart Rate (Admit) 91 bpm    Heart Rate (Exercise) 106 bpm    Heart Rate (Exit) 81 bpm    Rating of Perceived  Exertion (Exercise) 9    Symptoms Fatigue/ takes breaks    Comments Reviewed home exercise Rx    Duration Continue with 30 min of aerobic exercise without signs/symptoms of physical distress.    Intensity THRR unchanged      Progression   Progression Continue to progress workloads to maintain intensity without signs/symptoms of physical distress.    Average METs 2      Resistance Training   Training Prescription No    Weight NO weights on wednesdays      Interval Training   Interval Training No      T5 Nustep   Level 3    SPM 85    Minutes 25    METs 2      Home Exercise Plan   Plans to continue exercise at Home (comment)    Frequency Add 2 additional days to program exercise sessions.    Initial Home Exercises Provided 06/30/21             Functional Capacity:  6 Minute Walk     Row Name 05/20/21 1000 07/14/21 1310       6 Minute Walk   Phase Initial Discharge    Distance 1181 feet  Used  Nustep for test 2165 feet    Distance % Change -- 83 %    Distance Feet Change -- 984 ft    Walk Time 6 minutes 6 minutes    # of Rest Breaks 0 0    MPH 2.34 2.37    METS 2.98 3.49    RPE 8 11    Perceived Dyspnea  1 2    VO2 Peak 10.43 12.22    Symptoms Yes (comment) No    Comments Bilateral hip pain 3/10; SOB RPD = 1 --    Resting HR 91 bpm 84 bpm    Resting BP 118/78 114/70    Resting Oxygen Saturation  98 % 97 %    Exercise Oxygen Saturation  during 6 min walk 97 % 98 %    Max Ex. HR 94 bpm 116 bpm    Max Ex. BP 124/84 136/70    2 Minute Post BP 114/76 --             Psychological, QOL, Others - Outcomes: PHQ 2/9: Depression screen [facility] 2/9 07/16/2021 05/27/2021 05/20/2021 08/27/2020 10/12/2017  Decreased Interest 0 0 1 0 3  Down, Depressed, Hopeless 0 '3 2 2 3  ' PHQ - 2 Score 0 '3 3 2 6  ' Altered sleeping '3 3 3 3 3  ' Tired, decreased energy '1 3 2 1 2  ' Change in appetite 0 2 2 0 3  Feeling bad or failure about yourself  0 '2 2 2 3  ' Trouble concentrating '3 3 3 3  3  ' Moving slowly or fidgety/restless 1 0 0 1 0  Suicidal thoughts 0 - 0 - 0  PHQ-9 Score '8 16 15 12 20  ' Difficult doing work/chores Not difficult at all - Somewhat difficult - Not difficult at all    Quality of Life:  Quality of Life - 05/20/21 1040       Quality of Life   Select Quality of Life      Quality of Life Scores   Health/Function Pre 17.63 %    Socioeconomic Pre 23.94 %    Psych/Spiritual Pre 27.43 %    Family Pre 15.9 %    GLOBAL Pre 20.79 %             Personal Goals: Goals established at orientation with interventions provided to work toward goal.  Personal Goals and Risk Factors at Admission - 05/20/21 0959       Core Components/Risk Factors/Patient Goals on Admission    Weight Management Yes;Obesity;Weight Loss    Intervention Weight Management: Develop a combined nutrition and exercise program designed to reach desired caloric intake, while maintaining appropriate intake of nutrient and fiber, sodium and fats, and appropriate energy expenditure required for the weight goal.;Weight Management: Provide education and appropriate resources to help participant work on and attain dietary goals.;Weight Management/Obesity: Establish reasonable short term and long term weight goals.;Obesity: Provide education and appropriate resources to help participant work on and attain dietary goals.    Admit Weight 438 lb 15 oz (199.1 kg)    Expected Outcomes Understanding of distribution of calorie intake throughout the day with the consumption of 4-5 meals/snacks;Understanding recommendations for meals to include 15-35% energy as protein, 25-35% energy from fat, 35-60% energy from carbohydrates, less than 220m of dietary cholesterol, 20-35 gm of total fiber daily;Weight Loss: Understanding of general recommendations for a balanced deficit meal plan, which promotes 1-2 lb weight loss per week and includes a negative energy balance of [phone] kcal/d;Weight  Maintenance: Understanding  of the daily nutrition guidelines, which includes 25-35% calories from fat, 7% or less cal from saturated fats, less than 226m cholesterol, less than 1.5gm of sodium, & 5 or more servings of fruits and vegetables daily;Long Term: Adherence to nutrition and physical activity/exercise program aimed toward attainment of established weight goal;Short Term: Continue to assess and modify interventions until short term weight is achieved    Improve shortness of breath with ADL's Yes    Intervention Provide education, individualized exercise plan and daily activity instruction to help decrease symptoms of SOB with activities of daily living.    Expected Outcomes Short Term: Improve cardiorespiratory fitness to achieve a reduction of symptoms when performing ADLs;Long Term: Be able to perform more ADLs without symptoms or delay the onset of symptoms    Heart Failure Yes    Intervention Provide a combined exercise and nutrition program that is supplemented with education, support and counseling about heart failure. Directed toward relieving symptoms such as shortness of breath, decreased exercise tolerance, and extremity edema.    Expected Outcomes Improve functional capacity of life;Short term: Attendance in program 2-3 days a week with increased exercise capacity. Reported lower sodium intake. Reported increased fruit and vegetable intake. Reports medication compliance.;Short term: Daily weights obtained and reported for increase. Utilizing diuretic protocols set by physician.;Long term: Adoption of self-care skills and reduction of barriers for early signs and symptoms recognition and intervention leading to self-care maintenance.    Hypertension Yes    Intervention Provide education on lifestyle modifcations including regular physical activity/exercise, weight management, moderate sodium restriction and increased consumption of fresh fruit, vegetables, and low fat dairy, alcohol moderation, and smoking  cessation.;Monitor prescription use compliance.    Expected Outcomes Short Term: Continued assessment and intervention until BP is < 140/968mHG in hypertensive participants. < 130/8037mG in hypertensive participants with diabetes, heart failure or chronic kidney disease.;Long Term: Maintenance of blood pressure at goal levels.    Stress Yes    Intervention Offer individual and/or small group education and counseling on adjustment to heart disease, stress management and health-related lifestyle change. Teach and support self-help strategies.;Refer participants experiencing significant psychosocial distress to appropriate mental health specialists for further evaluation and treatment. When possible, include family members and significant others in education/counseling sessions.    Expected Outcomes Short Term: Participant demonstrates changes in health-related behavior, relaxation and other stress management skills, ability to obtain effective social support, and compliance with psychotropic medications if prescribed.;Long Term: Emotional wellbeing is indicated by absence of clinically significant psychosocial distress or social isolation.              Personal Goals Discharge:  Goals and Risk Factor Review     Row Name 05/25/21 1425 06/04/21 1725 06/29/21 1558 07/16/21 1430       Core Components/Risk Factors/Patient Goals Review   Personal Goals Review Weight Management/Obesity;Heart Failure;Stress Weight Management/Obesity;Heart Failure;Stress Weight Management/Obesity;Heart Failure;Stress Weight Management/Obesity;Heart Failure;Stress    Review DonTiborarted cardiace rehab on 05/25/21 and did fair for his fitness level. Vital signs were stable DonLansons been doing well with exercise and continues to rest when needed. DonMalekais lost 4 kg since starting phase 2 cardiac rehab. DonDonnes been doing well with exercise and continues to rest when needed. DonVirntinues to weigh less than  when he started CR. Vital signs have been stable. DonMercers been doing well with exercise and continues to rest when needed. DonKodantinues to weigh less than when he started  CR. Vital signs have been stable.    Expected Outcomes Ysidro will continue to participate in phase 2 cardiac rehab for exercise, nutrition and lifestyle modifications Zi will continue to participate in phase 2 cardiac rehab for exercise, nutrition and lifestyle modifications Darold will continue to participate in phase 2 cardiac rehab for exercise, nutrition and lifestyle modifications Vertis was encouraged to continue  exercise, nutrition and lifestyle modifications. Alhassan has an appointment for the nutrition manegement center and was encouraged to keep that appointment. Nutrition manangement will set up transportation to get Oziel to his appointment as he does not drive.             Exercise Goals and Review:  Exercise Goals     Row Name 05/20/21 1007             Exercise Goals   Increase Physical Activity Yes       Intervention Provide advice, education, support and counseling about physical activity/exercise needs.;Develop an individualized exercise prescription for aerobic and resistive training based on initial evaluation findings, risk stratification, comorbidities and participant's personal goals.       Expected Outcomes Short Term: Attend rehab on a regular basis to increase amount of physical activity.;Long Term: Add in home exercise to make exercise part of routine and to increase amount of physical activity.;Long Term: Exercising regularly at least 3-5 days a week.       Increase Strength and Stamina Yes       Intervention Provide advice, education, support and counseling about physical activity/exercise needs.;Develop an individualized exercise prescription for aerobic and resistive training based on initial evaluation findings, risk stratification, comorbidities and participant's personal  goals.       Expected Outcomes Short Term: Increase workloads from initial exercise prescription for resistance, speed, and METs.;Short Term: Perform resistance training exercises routinely during rehab and add in resistance training at home;Long Term: Improve cardiorespiratory fitness, muscular endurance and strength as measured by increased METs and functional capacity (6MWT)       Able to understand and use rate of perceived exertion (RPE) scale Yes       Intervention Provide education and explanation on how to use RPE scale       Expected Outcomes Short Term: Able to use RPE daily in rehab to express subjective intensity level;Long Term:  Able to use RPE to guide intensity level when exercising independently       Knowledge and understanding of Target Heart Rate Range (THRR) Yes       Intervention Provide education and explanation of THRR including how the numbers were predicted and where they are located for reference       Expected Outcomes Short Term: Able to state/look up THRR;Short Term: Able to use daily as guideline for intensity in rehab;Long Term: Able to use THRR to govern intensity when exercising independently       Understanding of Exercise Prescription Yes       Intervention Provide education, explanation, and written materials on patient's individual exercise prescription       Expected Outcomes Short Term: Able to explain program exercise prescription;Long Term: Able to explain home exercise prescription to exercise independently                Exercise Goals Re-Evaluation:  Exercise Goals Re-Evaluation     [place] Name 05/24/21 1449 06/18/21 1500 06/30/21 1448         Exercise Goal Re-Evaluation   Exercise Goals Review Increase Physical Activity;Increase Strength and Stamina;Able  to understand and use rate of perceived exertion (RPE) scale;Knowledge and understanding of Target Heart Rate Range (THRR);Understanding of Exercise Prescription Increase Physical Activity;Increase  Strength and Stamina;Able to understand and use rate of perceived exertion (RPE) scale;Knowledge and understanding of Target Heart Rate Range (THRR);Able to check pulse independently;Understanding of Exercise Prescription Increase Physical Activity;Increase Strength and Stamina;Able to understand and use rate of perceived exertion (RPE) scale;Knowledge and understanding of Target Heart Rate Range (THRR);Able to check pulse independently;Understanding of Exercise Prescription     Comments Pt's first day in the CRP2 program.  Pt understands the RPE scale, Exercise Rx, and THRR. Reviewed METs and Goals. Pt is progressing toward goals. He Reviewed home exercise Rx. Pt has been doing some wlaking at home on his old route to school. Encouraged pt to continue to walk and increase duratrion to 30-45 minutes.     Expected Outcomes Will continue to monitor patient and progress exercise workloads as tolerated. -- Pt will continue to walk at home              Nutrition & Weight - Outcomes:  Pre Biometrics - 05/20/21 0955       Pre Biometrics   Waist Circumference 68 inches    Hip Circumference 66.5 inches    Waist to Hip Ratio 1.02 %    Triceps Skinfold 70 mm    % Body Fat 62.6 %    Grip Strength 35 kg    Flexibility --   Not done   Single Leg Stand 5.61 seconds              Nutrition:   Nutrition Discharge:   Education Questionnaire Score:  Knowledge Questionnaire Score - 05/20/21 1041       Knowledge Questionnaire Score   Pre Score 21/24             Goals reviewed with patient; copy given to patient.Pt graduated from cardiac rehab program today with completion of  exercise sessions in Phase II. Pt maintained good attendance and progressed nicely during his participation in rehab as evidenced by increased MET level.   Medication list reconciled. Repeat  PHQ score-8  .Eshawn was encouraged to keep make an appointment for counseling with  [facility].  Pt has made significant  lifestyle changes and should be commended for his success. Pt feels he has achieved his goals during cardiac rehab.   Pt plans to continue by walking to the [place] where he lives. Josia is scheduled to meet with the nutrition management center on 08/05/21 after completing phase 2 cardiac rehab. Transportation will be set up for Farron to make his appointment. Vocational rehab  was not able to get in touch with Neilan he did missed the appointment that was set up for October. [place] increased his distance on his post nustep test by 984 feet. Jo said he really enjoyed participating in phase 2 cardiac rehab and feels stronger. Barnet Pall, RN,BSN 07/21/2021 2:06 PM

## 2021-07-16 ENCOUNTER — Other Ambulatory Visit: Payer: Self-pay

## 2021-07-16 ENCOUNTER — Encounter (HOSPITAL_COMMUNITY)
Admission: RE | Admit: 2021-07-16 | Discharge: 2021-07-16 | Disposition: A | Discharge status: Home / Self Care | Payer: Managed Care, Other (non HMO) | Source: Ambulatory Visit | Attending: Cardiology | Admitting: Cardiology

## 2021-07-16 ENCOUNTER — Ambulatory Visit (HOSPITAL_COMMUNITY): Payer: Managed Care, Other (non HMO)

## 2021-07-16 ENCOUNTER — Encounter (HOSPITAL_COMMUNITY): Payer: Managed Care, Other (non HMO)

## 2021-07-16 DIAGNOSIS — I422 Other hypertrophic cardiomyopathy: Secondary | ICD-10-CM

## 2021-07-26 ENCOUNTER — Ambulatory Visit: Payer: Managed Care, Other (non HMO) | Admitting: Dietician

## 2021-08-05 ENCOUNTER — Encounter: Payer: Managed Care, Other (non HMO) | Attending: Pediatrics | Admitting: Dietician

## 2021-08-05 ENCOUNTER — Other Ambulatory Visit: Payer: Self-pay

## 2021-08-05 ENCOUNTER — Encounter: Payer: Self-pay | Admitting: Dietician

## 2021-08-05 DIAGNOSIS — E66813 Obesity, class 3: Secondary | ICD-10-CM

## 2021-08-05 DIAGNOSIS — I420 Dilated cardiomyopathy: Secondary | ICD-10-CM

## 2021-08-05 DIAGNOSIS — Z6841 Body Mass Index (BMI) 40.0 and over, adult: Secondary | ICD-10-CM | POA: Diagnosis present

## 2021-08-05 NOTE — Patient Instructions (Addendum)
Recommend counseling. Walk 3 days per week for 30 minutes as tolerated and allowed by MD.  Avoid eating or overeating when you are bored or depressed.  "What can I do instead?" Walk, play with the dog, go outside, work on [employer], study for driving test, etc. Dedication, motivation  - Not perfect but working on it. Consider asking your mother to avoid buying the salty and sugary snacks and buy more fruit.  Have consistent meals.  Breakfast, lunch, and dinner. Add vegetables to all lunch and dinner meals. Rather than chips choose fruit. When you have a snack, keep to the portion on the package.

## 2021-08-05 NOTE — Progress Notes (Signed)
Medical Nutrition Therapy  Appointment Start time:  1355  Appointment End time:  1455  Primary concerns today: he would like to lose weight and states that overall he want to improve on himself (hygiene and depression)  Referral diagnosis: morbid obesity Preferred learning style: no preference indicated Learning readiness:  contemplating   NUTRITION ASSESSMENT   Anthropometrics  66" 436 lbs 08/05/2021 highest adult weight 2017 lbs 2014 213 lbs 2013  Clinical Medical Hx: hypertrophic cardiomyopathy, OSA with c-pap, depression, asthma Medications: see list  Lifestyle & Dietary Hx Patient lives with his mother, father and brother.  His parents share shopping and his mother primarily cooks.  Patient states that since graduating high school he has a fear of being big, poor hygiene, and lack of self care.  He would like to work on this.  Cardiomyopathy makes it difficult to do things. He states that his mother is trying to lose weight due to diabetes and father is supportive of them. Patient states that he is looking for a job working from home.  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    Supplements: none Sleep: 4 hours when he forgets his c-pap and 9-10 hours with the mask Stress / self-care: fair Current average weekly physical activity: just finished cardiac rehab.  He is trying to walk but walks inconsistently as he ignores his alert to walk that he has set on his phone.  24-Hr Dietary Recall First Meal 10-11): TV dinner or Hot pocket Snack: chips or cookies Second Meal: TV dinner or Hot pocket Snack: leftovers or chips or cookies Third Meal: fried chicken or fried pork chops, potatoes or rice, or macaroni and cheese, occasional vegetables OR past with meat sauce or chicken alfredo Snack: none Beverages: water, sweet tea, regular soda  NUTRITION DIAGNOSIS  [place]-3.3 Overweight/obesity As related to excessive  calories.  As evidenced by diet hx and patient report.   NUTRITION INTERVENTION  Nutrition education (E-1) on the following topics:  Mental health and relationship to food choices, motivation and dedication Importance of physical activity and limitations based on physical health Sodium and recommendations to reduce Label reading Balanced meals as well as nutrition density Mindfulness (food choices, eating to satisfied rather than full or overfull, eating only with meals or snacks when hungry  Handouts Provided Include  Heart Healthy Nutrition Therapy from AND Heart Healthy Cooking Tips from AND My Plate Snack list  Learning Style & Readiness for Change Teaching method utilized: Visual & Auditory  Demonstrated degree of understanding via: Teach Back  Barriers to learning/adherence to lifestyle change: motivation  Goals Established by Pt Recommend counseling. Walk 3 days per week for 30 minutes as tolerated and allowed by MD.  Avoid eating or overeating when you are bored or depressed.  "What can I do instead?" Walk, play with the dog, go outside, work on [employer], study for driving test, etc. Dedication, motivation  - Not perfect but working on it. Consider asking your mother to avoid buying the salty and sugary snacks and buy more fruit.  Have consistent meals.  Breakfast, lunch, and dinner. Add vegetables to all lunch and dinner meals. Rather than chips choose fruit. When you have a snack, keep to the portion on the package.   MONITORING & EVALUATION Dietary intake, weekly physical activity, and label reading in 2 months.  Next Steps  Patient is to call for questions.

## 2021-08-15 ENCOUNTER — Other Ambulatory Visit: Payer: Self-pay | Admitting: Family

## 2021-09-14 ENCOUNTER — Other Ambulatory Visit: Payer: Self-pay

## 2021-09-14 ENCOUNTER — Ambulatory Visit
Admission: EM | Admit: 2021-09-14 | Discharge: 2021-09-14 | Disposition: A | Discharge status: Home / Self Care | Payer: Managed Care, Other (non HMO) | Attending: Physician Assistant | Admitting: Physician Assistant

## 2021-09-14 ENCOUNTER — Encounter: Payer: Self-pay | Admitting: Emergency Medicine

## 2021-09-14 DIAGNOSIS — R590 Localized enlarged lymph nodes: Secondary | ICD-10-CM

## 2021-09-14 LAB — POCT MONO SCREEN (KUC): Mono, POC: NEGATIVE

## 2021-09-14 MED ORDER — AMOXICILLIN 500 MG PO CAPS: 500.0000 mg | ORAL_CAPSULE | Freq: Three times a day (TID) | ORAL | 0 refills | Status: DC

## 2021-09-14 NOTE — Discharge Instructions (Signed)
Recommend warm compress Follow up with Primary Care Physician is no improvement

## 2021-09-14 NOTE — ED Triage Notes (Signed)
Pt here for lump to left side of neck x 2 days with some pain

## 2021-09-14 NOTE — ED Provider Notes (Signed)
[facility]    CSN: [id number] Arrival date & time: 09/14/21  1811      History   Chief Complaint Chief Complaint  Patient presents with   Mass    HPI Devin Marshall is a 21 y.o. male.   Pt complains of painful swollen "lump" to left side of neck that started two days ago.  Denies fever, chills, n/v/d, congestion, cough, sore throat.  He has tried nothing for the sx.    Past Medical History:  Diagnosis Date   ADHD (attention deficit hyperactivity disorder)    Allergy    Anxiety    Asthma    Asthma    Phreesia 01/28/2020   Conjunctivitis 05/08/2013   Depression    Eating disorder    Eczema    Morbid obesity ([place])    Obesity    Situational anxiety    Sleep apnea    Undiagnosed cardiac murmurs 12/10/2012    Patient Active Problem List   Diagnosis Date Noted   Encounter for routine child health examination without abnormal findings 01/30/2020   Dilated cardiomyopathy ([place]) 01/30/2020   LVH (left ventricular hypertrophy) 11/10/2017   BMI (body mass index), pediatric, > 99% for age 06/08/2015    Past Surgical History:  Procedure Laterality Date   CIRCUMCISION         Home Medications    Prior to Admission medications   Medication Sig Start Date End Date Taking? Authorizing Provider  amoxicillin (AMOXIL) 500 MG capsule Take 1 capsule (500 mg total) by mouth 3 (three) times daily. 09/14/21  Yes Ward, Lenise Arena, PA-C  albuterol (PROVENTIL) (2.5 MG/3ML) 0.083% nebulizer solution Take 3 mLs (2.5 mg total) by nebulization every 4 (four) hours as needed for wheezing or shortness of breath. 01/28/20   Marcha Solders, MD  albuterol (VENTOLIN HFA) 108 (90 Base) MCG/ACT inhaler INHALE 2 PUFFS INTO THE LUNGS EVERY 4 HOURS AS NEEDED FOR WHEEZING OR SHORTNESS OF BREATH. 03/17/21   Marcha Solders, MD  budesonide (PULMICORT) 0.5 MG/2ML nebulizer solution TAKE 2 MLS (0.5 MG TOTAL) BY NEBULIZATION 2 (TWO) TIMES DAILY. Patient taking differently: Take 0.5 mg by  nebulization 2 (two) times daily as needed (shortness of breath). 03/30/21 07/16/21  Marcha Solders, MD  buPROPion (WELLBUTRIN XL) 150 MG 24 hr tablet Take 150 mg by mouth daily.    [provider]  fluticasone (FLOVENT HFA) 110 MCG/ACT inhaler Inhale 2 puffs into the lungs 2 (two) times daily. Patient not taking: Reported on 08/05/2021 10/11/18   Kristen Loader, DO  metoprolol succinate (TOPROL-XL) 50 MG 24 hr tablet Take 50 mg by mouth daily. 03/02/21   [provider]  pantoprazole (PROTONIX) 40 MG tablet Take 40 mg by mouth daily.    [provider]    Family History Family History  Problem Relation Age of Onset   Hypertension Mother    Asthma Mother    Sleep apnea Mother    Diabetes Mother    Hyperlipidemia Mother    Obesity Mother    Hypertension Maternal Grandmother    Diabetes Paternal Grandmother    Asthma Brother        "wheezing", young age   Sleep apnea Brother    Diabetes type I Brother    Drug abuse Father    Heart disease Neg Hx    Stroke Neg Hx    Depression Neg Hx    Kidney disease Neg Hx    Alcohol abuse Neg Hx    Arthritis Neg  Hx    Birth defects Neg Hx    Cancer Neg Hx    COPD Neg Hx    Early death Neg Hx    Hearing loss Neg Hx    Learning disabilities Neg Hx    Mental illness Neg Hx    Mental retardation Neg Hx    Miscarriages / Stillbirths Neg Hx    Vision loss Neg Hx    Varicose Veins Neg Hx     Social History Social History   Tobacco Use   Smoking status: Never    Passive exposure: Yes   Smokeless tobacco: Never   Tobacco comments:    both parents smoke  Substance Use Topics   Alcohol use: No    Alcohol/week: 0.0 standard drinks   Drug use: No     Allergies   Other   Review of Systems Review of Systems  Constitutional:  Negative for chills and fever.  HENT:  Negative for ear pain and sore throat.   Eyes:  Negative for pain and visual disturbance.  Respiratory:  Negative for cough and shortness of  breath.   Cardiovascular:  Negative for chest pain and palpitations.  Gastrointestinal:  Negative for abdominal pain and vomiting.  Genitourinary:  Negative for dysuria and hematuria.  Musculoskeletal:  Positive for neck pain. Negative for arthralgias and back pain.  Skin:  Negative for color change and rash.  Neurological:  Negative for seizures and syncope.  All other systems reviewed and are negative.   Physical Exam Triage Vital Signs ED Triage Vitals [09/14/21 1820]  Enc Vitals Group     BP 133/68     Pulse Rate 89     Resp 18     Temp 98.6 F (37 C)     Temp Source Oral     SpO2 96 %     Weight      Height      Head Circumference      Peak Flow      Pain Score 5     Pain Loc      Pain Edu?      Excl. in [place]?    No data found.  Updated Vital Signs BP 133/68 (BP Location: Left Arm)    Pulse 89    Temp 98.6 F (37 C) (Oral)    Resp 18    SpO2 96%   Visual Acuity Right Eye Distance:   Left Eye Distance:   Bilateral Distance:    Right Eye Near:   Left Eye Near:    Bilateral Near:     Physical Exam Vitals and nursing note reviewed.  Constitutional:      General: He is not in acute distress.    Appearance: He is well-developed.  HENT:     Head: Normocephalic and atraumatic.  Eyes:     Conjunctiva/sclera: Conjunctivae normal.  Cardiovascular:     Rate and Rhythm: Normal rate and regular rhythm.     Heart sounds: No murmur heard. Pulmonary:     Effort: Pulmonary effort is normal. No respiratory distress.     Breath sounds: Normal breath sounds.  Abdominal:     Palpations: Abdomen is soft.     Tenderness: There is no abdominal tenderness.  Musculoskeletal:        General: No swelling.     Cervical back: Neck supple.  Lymphadenopathy:     Cervical: Cervical adenopathy present.  Skin:    General: Skin is warm and dry.  Capillary Refill: Capillary refill takes less than 2 seconds.  Neurological:     Mental Status: He is alert.  Psychiatric:         Mood and Affect: Mood normal.     UC Treatments / Results  Labs (all labs ordered are listed, but only abnormal results are displayed) Labs Reviewed  POCT MONO SCREEN [facility])    EKG   Radiology No results found.  Procedures Procedures (including critical care time)  Medications Ordered in UC Medications - No data to display  Initial Impression / Assessment and Plan / UC Course  I have reviewed the triage vital signs and the nursing notes.  Pertinent labs & imaging results that were available during my care of the patient were reviewed by me and considered in my medical decision making (see chart for details).     Painful cervical lymphadenopathy. Mono negative.  Will cover for antibiotic.  Advised f/u with PCP if no improvement.  Final Clinical Impressions(s) / UC Diagnoses   Final diagnoses:  Lymphadenopathy, cervical     Discharge Instructions      Recommend warm compress Follow up with Primary Care Physician is no improvement    ED Prescriptions     Medication Sig Dispense Auth. Provider   amoxicillin (AMOXIL) 500 MG capsule Take 1 capsule (500 mg total) by mouth 3 (three) times daily. 21 capsule Ward, Lenise Arena, PA-C      PDMP not reviewed this encounter.   Ward, Lenise Arena, PA-C 09/14/21 [phone]

## 2021-10-04 ENCOUNTER — Ambulatory Visit: Payer: Managed Care, Other (non HMO) | Admitting: Dietician

## 2021-12-18 ENCOUNTER — Other Ambulatory Visit: Payer: Self-pay | Admitting: Family

## 2022-04-03 IMAGING — CT CT HEAD W/O CM
4 series · 17 of 47 positions shown, 19 images · non-contrast
Comparison: None.

CLINICAL DATA: Status post trauma.

EXAM:
CT HEAD WITHOUT CONTRAST
TECHNIQUE: Contiguous axial images were obtained from the base of the skull
through the vertex without intravenous contrast.

[Series 3: head without · axial · non-contrast · 0.43mm/px · z∈[-61,+69]mm · 7 of 36 slices shown, 9 images]
[im 5/36  brain]
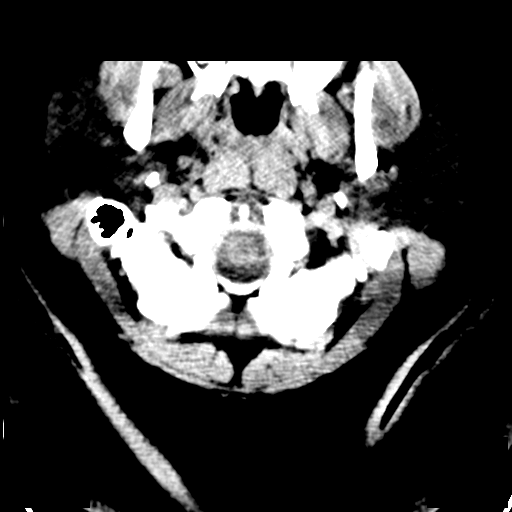
[im 5/36  bone]
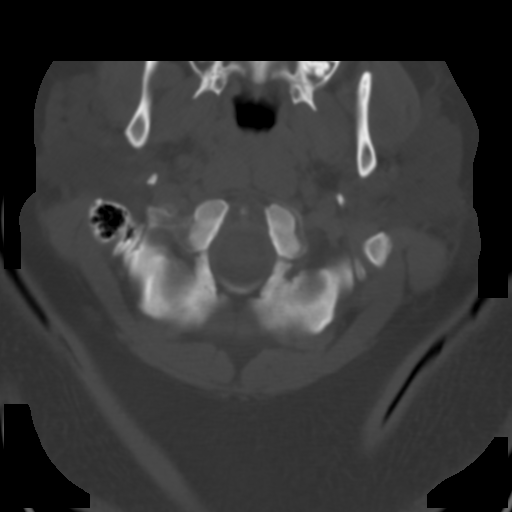
[im 9/36  brain]
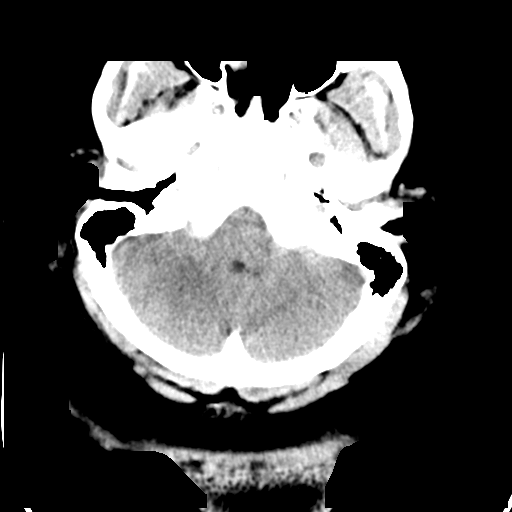
[im 14/36  brain]
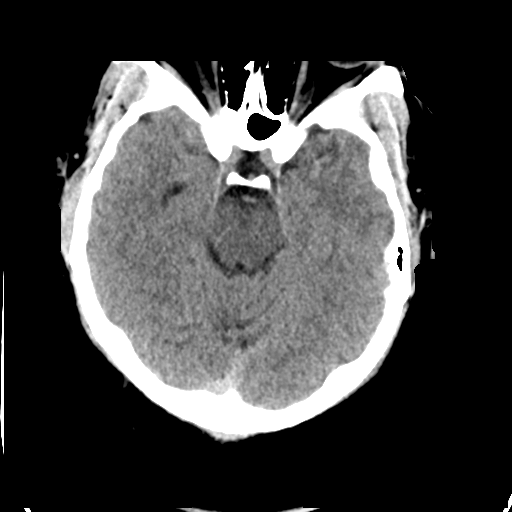
[im 18/36  brain]
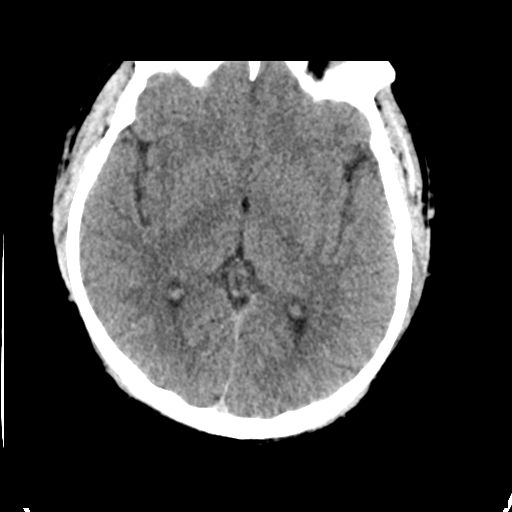
[im 22/36  brain]
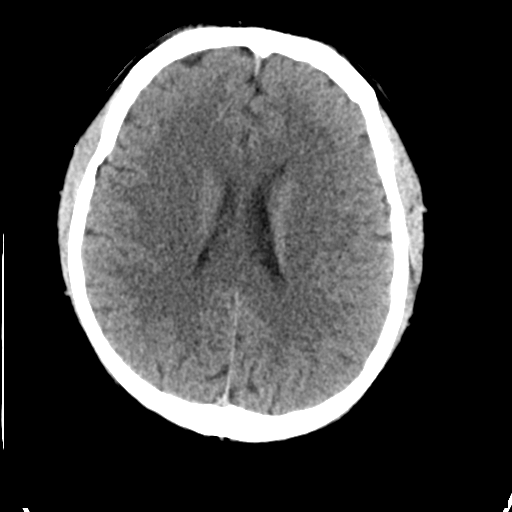
[im 22/36  bone]
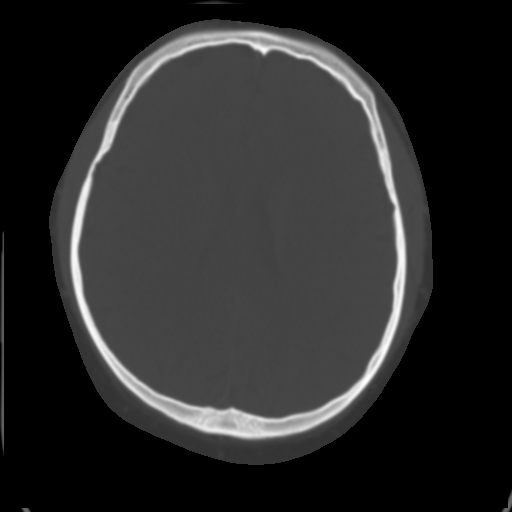
[im 27/36  brain]
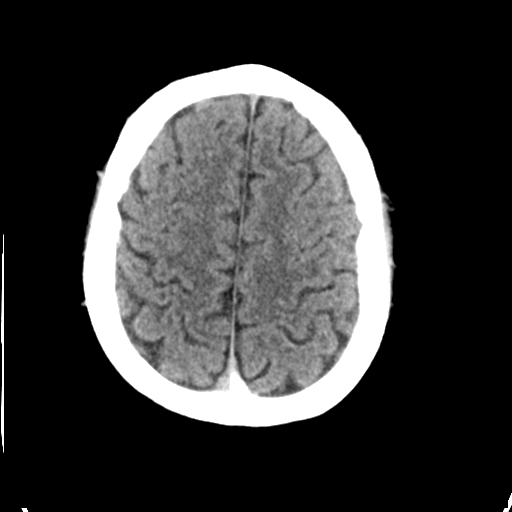
[im 31/36  brain]
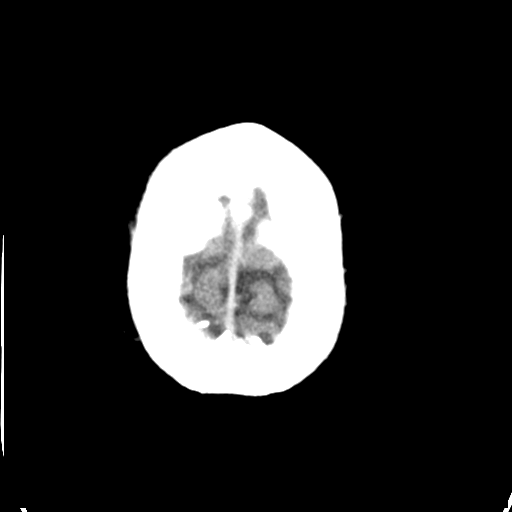

[Series 4: head bone · axial · 0.43mm/px · z∈[-65,-3]mm · 4 of 89 slices shown]
[im 9/89  bone]
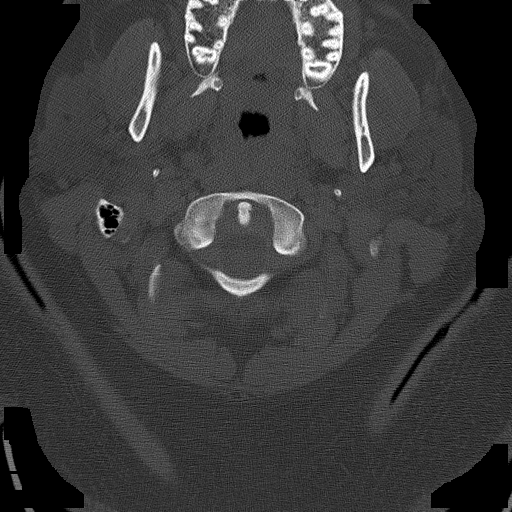
[im 18/89  bone]
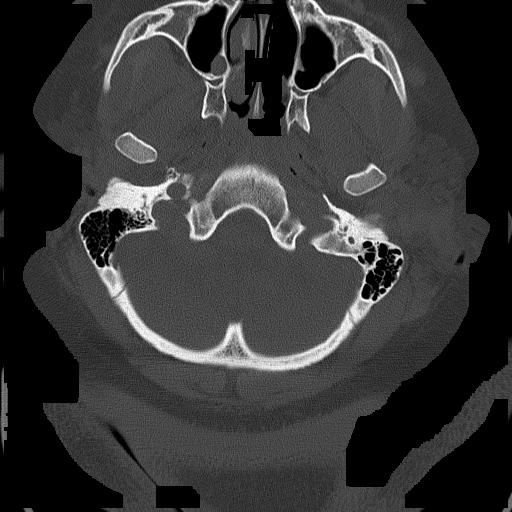
[im 27/89  bone]
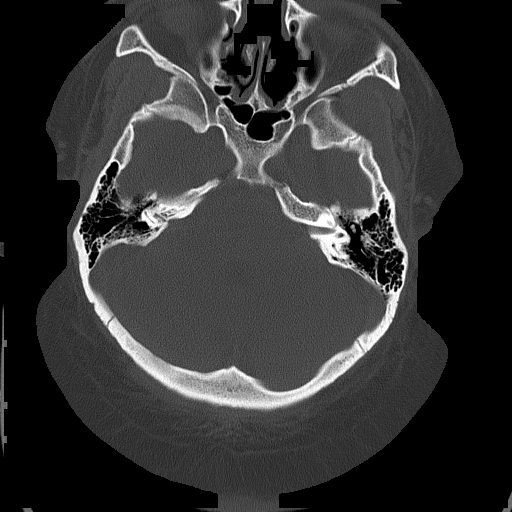
[im 40/89  bone]
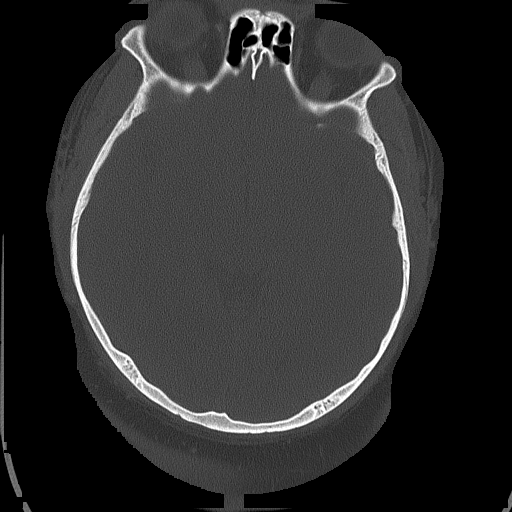

[Series 5: head without cor · coronal · non-contrast · 0.48mm/px · 3 of 81 slices shown]
[im 27/81  brain]
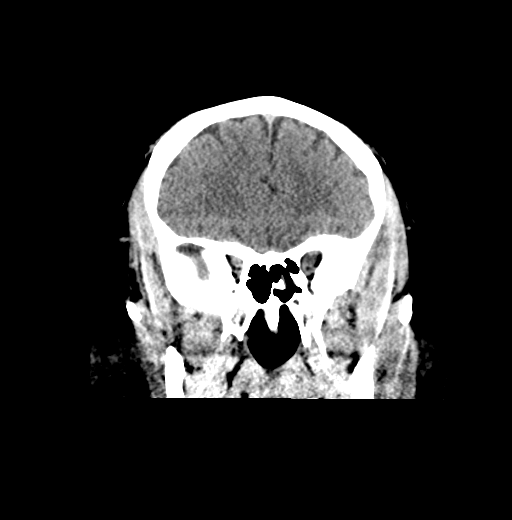
[im 36/81  brain]
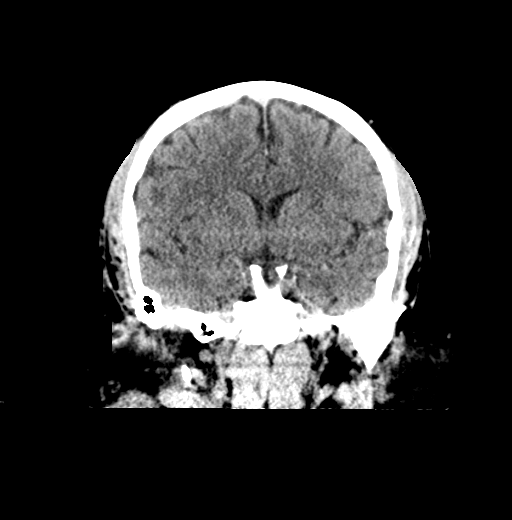
[im 45/81  brain]
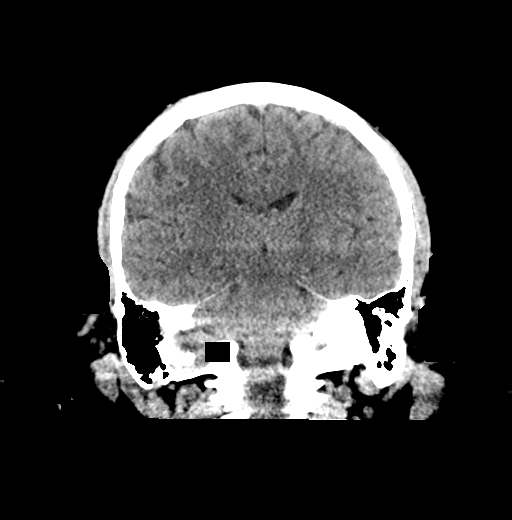

[Series 6: head without sag · sagittal · non-contrast · 0.39mm/px · 3 of 67 slices shown]
[im 23/67  brain]
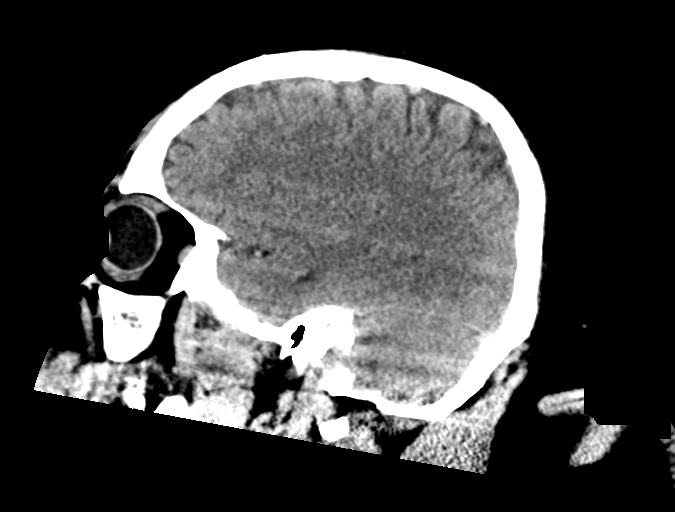
[im 34/67  brain]
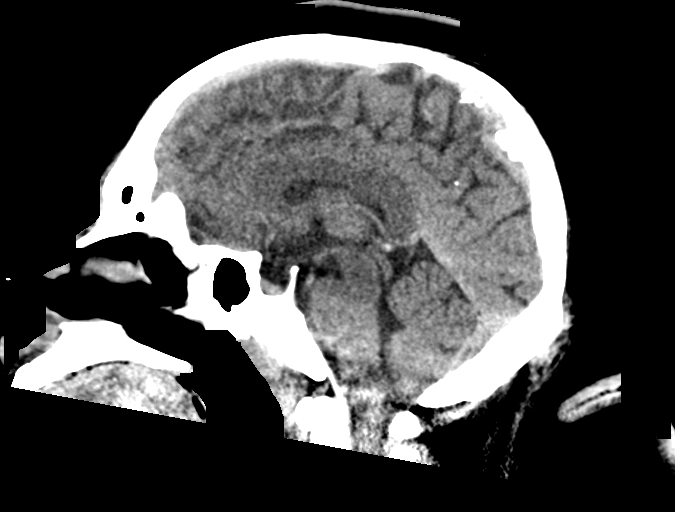
[im 45/67  brain]
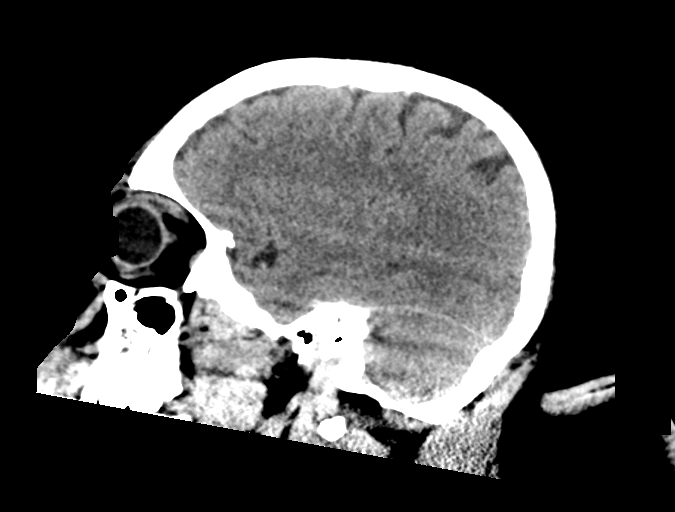

[17 of 47 positions shown; findings below may reference images not displayed]

FINDINGS: Brain: No evidence of acute infarction, hemorrhage, hydrocephalus,
extra-axial collection or mass lesion/mass effect.

Vascular: No hyperdense vessel or unexpected calcification.

Skull: Normal. Negative for fracture or focal lesion.

Sinuses/Orbits: No acute finding.

Other: None.
IMPRESSION: No acute intracranial pathology.

## 2022-04-11 ENCOUNTER — Encounter: Payer: Self-pay | Admitting: Pediatrics

## 2022-09-14 ENCOUNTER — Encounter: Payer: Self-pay | Admitting: General Practice

## 2022-09-22 ENCOUNTER — Institutional Professional Consult (permissible substitution): Payer: Managed Care, Other (non HMO) | Admitting: Internal Medicine

## 2022-11-23 NOTE — Telephone Encounter (Signed)
error 

## 2022-12-23 ENCOUNTER — Ambulatory Visit (INDEPENDENT_AMBULATORY_CARE_PROVIDER_SITE_OTHER): Payer: Managed Care, Other (non HMO) | Admitting: Family

## 2022-12-23 ENCOUNTER — Encounter: Payer: Self-pay | Admitting: Family

## 2022-12-23 VITALS — BP 149/85 | HR 85 | Temp 97.7°F | Ht 66.0 in | Wt >= 6400 oz

## 2022-12-23 DIAGNOSIS — I420 Dilated cardiomyopathy: Secondary | ICD-10-CM | POA: Diagnosis not present

## 2022-12-23 DIAGNOSIS — J4530 Mild persistent asthma, uncomplicated: Secondary | ICD-10-CM

## 2022-12-23 DIAGNOSIS — M7989 Other specified soft tissue disorders: Secondary | ICD-10-CM | POA: Diagnosis not present

## 2022-12-23 DIAGNOSIS — F32A Depression, unspecified: Secondary | ICD-10-CM

## 2022-12-23 DIAGNOSIS — F419 Anxiety disorder, unspecified: Secondary | ICD-10-CM

## 2022-12-23 NOTE — Assessment & Plan Note (Signed)
chronic since childhood pt has nebulizer w/Albuterol, rescue Albuterol, Pulmicort & Flovent inhalers pt reports all inhalers are prn, has not needed lately, does not need refills triggers are exercise, hot, humid weather, allergy season f/u prn

## 2022-12-23 NOTE — Assessment & Plan Note (Signed)
chronic states he has been evaluated in past, but wants a new evaluation as he feels his "mind is just not right"  has been taking Bupropion 150mg  qd, no refill needed today denies any hallucinations or delusions sending referral to Psych

## 2022-12-23 NOTE — Patient Instructions (Addendum)
Welcome to [facility], It was a pleasure meeting you today!   I have sent a referral to Psychiatry and Cardiology. They will call you directly to schedule an appointment.  As discussed, try to get in 8 cups of water daily. Avoid sodium in your diet as much as possible to decrease swelling in your body. Keep your legs elevated when resting. Work on exercise slowly, building up intensity: either walking slightly faster after a few days OR for longer duration, increasing by 10 minutes every few days. The most important thing is to just keep moving!  Start working on what we discussed today regarding your weight loss goals:  1) Eat small portions, ok to eat 6 mini meals vs. 3 big meals if this controls your hunger  better. 2) Eat until full and then STOP! This is your body telling you it has had enough. 3) Drink at least 64 oz water = 2 liters = 8, 8oz cups DAILY. 4) Eat most of your calories earlier in the day (if you work during the day).  Want to eat within a 8-10hour window if possible. No eating after 7pm, only no calorie drinks or water from 7p until bedtime. 5) Look for low carb, high protein foods/recipes. Avoid simple carbs including sweets, white bread, rice, potatoes, pasta. Look for whole grain/whole wheat/or almond flour if eating these foods. 6) High protein foods: meats (limit red meat), including [place], chicken, pork, FISH (best source) nuts, cheese, beans (black beans, soy/edamame beans are best). Protein drinks, bars are also good but must have low sugar, look for < 10 grams. 7) Avoid processed/refined sugar and artificial sweeteners if possible. Stevia, Erythritol, or [place] fruit sweetener are preferred, natural, no calorie sweeteners.  Natural sweeteners like Agave or honey in very small amounts are ok also.    PLEASE NOTE: If you had any LAB tests please let us know if you have not heard back within a few days. You may see your results on MyChart  before we have a chance to review them but we will give you a call once they are reviewed by [place]. If we ordered any REFERRALS today, please let us know if you have not heard from their office within the next week.  Let us know through MyChart if you are needing REFILLS, or have your pharmacy send [place] the request. You can also use MyChart to communicate with me or any office staff.

## 2022-12-23 NOTE — Assessment & Plan Note (Addendum)
chronic - dx when in middle school has been under [facility] last seen 05/2022 advised to f/u w/adult cardiology per review of EMR notes, pt completed cardiac rehab but did not continue to exercise,  no changes seen w/last 2D echo, pt also has OSA, but not using CPAP pt would like to establish with adult cardiology here in [facility] sending referral

## 2022-12-23 NOTE — Assessment & Plan Note (Addendum)
chronic has seen nutrition in past wt. Loss strategies reviewed including portion control, less carbs including sweets, eating most of calories earlier in day, drinking 64oz water qd, and establishing daily exercise routine. advised on MWM thru [facility], referring to Cardiology and they may have a program for him as well, will continue to monitor

## 2022-12-23 NOTE — Progress Notes (Addendum)
New Patient Office Visit  Subjective:  Patient ID: Devin Marshall, male    DOB: September 13, 2000  Age: 22 y.o. MRN: [id number]  CC:  Chief Complaint  Patient presents with   Establish Care   Leg Swelling    Pt c/o left leg swelling more than the right leg. Diagnosed with a heart condition when younger. Would like to see a cardiologist.    Hypertension    HPI Devin Marshall presents for establishing care today.  Asthma:  reports having for years, triggers are exertion, heat & humidity, sometimes allergy season, cold weather is not usually an issue, uses Albuterol, Pulmicort, Flovent, and nebulizer, currently just using prn.  HTN & Cardiomyopathy: congenital murmur, dx in middle school. Has been followed by [facility] cardiology, has had annual 2D echos, last one he thinks was fine about 4-5 mos ago. He is taking Metoprolol 75mg  qd. He would like to establish with Cardiology here in town.  Morbid obesity:  pt has been obese since a young age. He reports seeing a nutritionist when he was younger but not recently, states he does not remember much of the teaching but does not think it helped. He is focused on trying to exercise more but this causes his heart to palpate, skip beats, and he gets anxious.   Assessment & Plan:  Dilated cardiomyopathy [facility]) Assessment & Plan: chronic - dx when in middle school has been under [facility] last seen 05/2022 advised to f/u w/adult cardiology per review of EMR notes, pt completed cardiac rehab but did not continue to exercise, no changes seen w/last 2D echo, pt also has OSA, but not using CPAP pt would like to establish with adult cardiology here in [place] sending referral  Orders: -     Ambulatory referral to Cardiology  Anxiety and depression Assessment & Plan: chronic states he has been evaluated in past, but wants a new evaluation as he feels his "mind is just not right"  has been taking Bupropion 150mg  qd, no refill needed  today denies any hallucinations or delusions sending referral to Psych  Orders: -     Ambulatory referral to Psychiatry  Morbid obesity ([facility]) Assessment & Plan: chronic has seen nutrition in past wt. Loss strategies reviewed including portion control, less carbs including sweets, eating most of calories earlier in day, drinking 64oz water qd, and establishing daily exercise routine. advised on MWM thru [facility], referring to Cardiology and they may have a program for him as well, will continue to monitor    Leg swelling- per EMR last peds cardiology notes, no change in heart fx, pt reports he has had off & on leg swelling, today left >right, non-pitting. Advised pt on drinking 2L of water qd, eating low sodium diet, continuing to do exercise, building slowly. Sending referral to Cardiology to f/u.  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 75 mL 12   albuterol (VENTOLIN HFA) 108 (90  Base) MCG/ACT inhaler INHALE 2 PUFFS INTO THE LUNGS EVERY 4 HOURS AS NEEDED FOR WHEEZING OR SHORTNESS OF BREATH. 6.7 each 13   buPROPion (WELLBUTRIN XL) 150 MG 24 hr tablet Take 150 mg by mouth daily.     fluticasone (FLOVENT HFA) 110 MCG/ACT inhaler Inhale 2 puffs into the lungs 2 (two) times daily. 1 Inhaler 12   metoprolol succinate (TOPROL-XL) 50 MG 24 hr tablet Take 75 mg by mouth daily. Take 1 pill in am and 1/2  pill qhs.     pantoprazole (PROTONIX) 40 MG tablet Take 40 mg by mouth daily.     amoxicillin (AMOXIL) 500 MG capsule Take 1 capsule (500 mg total) by mouth 3 (three) times daily. 21 capsule 0   budesonide (PULMICORT) 0.5 MG/2ML nebulizer solution TAKE 2 MLS (0.5 MG TOTAL) BY NEBULIZATION 2 (TWO) TIMES DAILY. (Patient taking differently: Take 0.5 mg by nebulization 2 (two) times daily as needed (shortness of breath).) 360 mL 4   No facility-administered medications prior to visit.   Past Medical History:  Diagnosis Date   ADHD (attention deficit hyperactivity disorder)    Allergy    Anxiety    Asthma    Asthma    Phreesia 01/28/2020   BMI (body mass index), pediatric, > 99% for age 06/08/2015   Conjunctivitis 05/08/2013   Depression    Eating disorder    Eczema    Encounter for routine child health examination without abnormal findings 01/30/2020   Morbid obesity ([facility])    Obesity    Situational anxiety    Sleep apnea    Undiagnosed cardiac murmurs 12/10/2012   Past Surgical History:  Procedure Laterality Date   CIRCUMCISION      Objective:   Today's Vitals: BP (!) 149/85   Pulse 85   Temp 97.7 F (36.5 C) (Temporal)   Ht 5\' 6"  (1.676 m)   Wt (!) 449 lb (203.7 kg)   SpO2 96%   BMI 72.47 kg/m   Physical Exam Vitals and nursing note reviewed.  Constitutional:      General: He is not in acute distress.    Appearance: Normal appearance. He is morbidly obese.  HENT:     Head: Normocephalic.  Cardiovascular:     Rate and Rhythm: Normal rate and regular rhythm.     Heart sounds: Murmur heard.     Systolic murmur is present with a grade of 4/6.  Pulmonary:     Effort: Pulmonary effort is normal.     Breath sounds: Normal breath sounds.  Musculoskeletal:        General: Normal range of motion.     Cervical back: Normal range of motion.  Skin:    General: Skin is warm and dry.  Neurological:     Mental Status: He is alert and oriented to person, place, and time.   Psychiatric:        Mood and Affect: Mood normal.    *Extra time ( ) spent with patient today which consisted of chart review, discussing diagnoses, work up, treatment, answering questions, and documentation.   Dulce Sellar, NP

## 2023-01-05 ENCOUNTER — Other Ambulatory Visit: Payer: Self-pay | Admitting: Family

## 2023-01-05 ENCOUNTER — Telehealth: Payer: Self-pay | Admitting: Family

## 2023-01-05 ENCOUNTER — Other Ambulatory Visit: Payer: Self-pay

## 2023-01-05 MED ORDER — ALBUTEROL SULFATE HFA 108 (90 BASE) MCG/ACT IN AERS: 2.0000 | INHALATION_SPRAY | RESPIRATORY_TRACT | 2 refills | Status: DC | PRN

## 2023-01-05 NOTE — Telephone Encounter (Signed)
Pt's mother states pt is needing to get back on his inhaler. Has been struggling the last few days.   albuterol (VENTOLIN HFA) 108 (90 Base) MCG/ACT inhaler    [id number] [place], [place] - [address] Phone: [phone]  Fax: [phone]

## 2023-01-05 NOTE — Telephone Encounter (Signed)
Rx sent 

## 2023-01-05 NOTE — Telephone Encounter (Signed)
yes, thanks, with 2 refills

## 2023-02-15 ENCOUNTER — Ambulatory Visit: Payer: Managed Care, Other (non HMO) | Attending: Internal Medicine | Admitting: Internal Medicine

## 2023-02-15 ENCOUNTER — Ambulatory Visit: Payer: Managed Care, Other (non HMO)

## 2023-02-15 ENCOUNTER — Encounter: Payer: Self-pay | Admitting: Internal Medicine

## 2023-02-15 ENCOUNTER — Ambulatory Visit: Payer: Managed Care, Other (non HMO) | Admitting: Genetic Counselor

## 2023-02-15 VITALS — BP 138/78 | HR 92 | Ht 67.0 in | Wt >= 6400 oz

## 2023-02-15 DIAGNOSIS — I422 Other hypertrophic cardiomyopathy: Secondary | ICD-10-CM | POA: Diagnosis not present

## 2023-02-15 DIAGNOSIS — R0609 Other forms of dyspnea: Secondary | ICD-10-CM

## 2023-02-15 DIAGNOSIS — E782 Mixed hyperlipidemia: Secondary | ICD-10-CM

## 2023-02-15 MED ORDER — PANTOPRAZOLE SODIUM 40 MG PO TBEC: DELAYED_RELEASE_TABLET | ORAL | 0 refills | Status: DC

## 2023-02-15 MED ORDER — FUROSEMIDE 20 MG PO TABS: 20.0000 mg | ORAL_TABLET | Freq: Every day | ORAL | 11 refills | Status: DC | PRN

## 2023-02-15 NOTE — Progress Notes (Unsigned)
Applied a 14 day Zio XT monitor to patient in the office ?

## 2023-02-15 NOTE — Progress Notes (Signed)
Cardiology Office Note:    Date:  02/15/2023   ID:  Devin Marshall, DOB 2001-04-28, MRN [id number]  PCP:  Dulce Sellar, NP   [facility] Providers Cardiologist:  Christell Constant, MD     Referring MD: Theodora Blow, MD   CC: HCM eval  Consulted for the evaluation of HCM at the behest of Dr. Casilda Carls   History of Present Illness:    Devin Marshall is a 22 y.o. male with a hx of super morbid obesity and hypertrophic cardiomyopathy.  From Pediatric imaging, he has a Doppler gradient of 21 mm Hg and moderate LVOT obstruction.  In 2019 he had no issues with stress imaging.  He was originally diagnosed at HCM at a health fare at a screening ECG.  Previously saw Dr. Runell Gess at [facility] then Dr. Myrtie Neither.   Patient notes no SOB at rest and DOE with exertion Notes no fatigue. Notes no palpitations Notes no CP. Notes no Dizziness. Notes no syncope.  He was told previously that he could not do weight lifting or strenuous water. Told not to drink caffeine.   Told he could not sprint or walk.   Family history is significant for: 1. PGF- MI with ICD placed post MI he was late 62s 2. Healthy 9yo brother and 25yo and 38yo half brothers  3. No HCM or SCD  He completed cardiac rehab and he improved his exercise output.      Past Medical History:  Diagnosis Date   ADHD (attention deficit hyperactivity disorder)    Allergy    Anxiety    Asthma    Asthma    Phreesia 01/28/2020   BMI (body mass index), pediatric, > 99% for age 48/05/2015   Conjunctivitis 05/08/2013   Depression    Eating disorder    Eczema    Encounter for routine child health examination without abnormal findings 01/30/2020   Morbid obesity (HCC)    Obesity    Situational anxiety    Sleep apnea    Undiagnosed cardiac murmurs 12/10/2012    Past Surgical History:  Procedure Laterality Date   CIRCUMCISION      Current Medications: Current Meds  Medication Sig   albuterol  (PROVENTIL) (2.5 MG/3ML) 0.083% nebulizer solution Take 3 mLs (2.5 mg total) by nebulization every 4 (four) hours as needed for wheezing or shortness of breath.   budesonide (PULMICORT) 0.5 MG/2ML nebulizer solution TAKE 2 MLS (0.5 MG TOTAL) BY NEBULIZATION 2 (TWO) TIMES DAILY. (Patient taking differently: Take 0.5 mg by nebulization 2 (two) times daily as needed (shortness of breath).)   buPROPion (WELLBUTRIN XL) 150 MG 24 hr tablet Take 150 mg by mouth daily.   fluticasone (FLOVENT HFA) 110 MCG/ACT inhaler Inhale 2 puffs into the lungs 2 (two) times daily.   furosemide (LASIX) 20 MG tablet Take 1 tablet (20 mg total) by mouth daily as needed (leg swelling).   levalbuterol (XOPENEX HFA) 45 MCG/ACT inhaler Inhale 1-2 puffs into the lungs every 4 (four) hours as needed for wheezing.   metoprolol succinate (TOPROL-XL) 50 MG 24 hr tablet Take 75 mg by mouth daily. Take 1 pill in am and 1/2 pill qhs.   [DISCONTINUED] pantoprazole (PROTONIX) 40 MG tablet Take 40 mg by mouth daily.     Allergies:   Other   Social History   Socioeconomic History   Marital status: Single    Spouse name: Not on file   Number of children: 0   Years of education: 103  Highest education level: High school graduate  Occupational History   Occupation: Consulting civil engineer    Comment: student   Occupation: unemployed  Tobacco Use   Smoking status: Never    Passive exposure: Yes   Smokeless tobacco: Never   Tobacco comments:    both parents smoke  Vaping Use   Vaping Use: Never used  Substance and Sexual Activity   Alcohol use: Never   Drug use: Never   Sexual activity: Never  Other Topics Concern   Not on file  Social History Narrative   Lives with mom and dad and brother   Gradated from [employer] in 2022. Updated 05/20/21 MWW                           Social Determinants of Health   Financial Resource Strain: Not on file  Food Insecurity: Not on file  Transportation Needs: Not on file  Physical  Activity: Not on file  Stress: Not on file  Social Connections: Not on file     Family History: The patient's family history includes Asthma in his brother and mother; Diabetes in his mother and paternal grandmother; Diabetes type I in his brother; Drug abuse in his father; Hyperlipidemia in his mother; Hypertension in his maternal grandmother and mother; Obesity in his mother; Sleep apnea in his brother and mother. There is no history of Heart disease, Stroke, Depression, Kidney disease, Alcohol abuse, Arthritis, Birth defects, Cancer, COPD, Early death, Hearing loss, Learning disabilities, Mental illness, Mental retardation, Miscarriages / Stillbirths, Vision loss, or Varicose Veins.  ROS:   Please see the history of present illness.     EKGs/Labs/Other Studies Reviewed:    The following studies were reviewed today:       Recent Labs: No results found for requested labs within last 365 days.  Recent Lipid Panel    Component Value Date/Time   CHOL 171 (H) 10/12/2017 1142   TRIG 72 10/12/2017 1142   HDL 33 (L) 10/12/2017 1142   CHOLHDL 5.8 (H) 06/18/2015 1612   VLDL 20 06/18/2015 1612   LDLCALC 124 (H) 10/12/2017 1142    Physical Exam:    VS:  BP 138/78   Pulse 92   Ht 5\' 7"  (1.702 m)   Wt (!) 456 lb (206.8 kg)   SpO2 97%   BMI 71.42 kg/m     Wt Readings from Last 3 Encounters:  02/15/23 (!) 456 lb (206.8 kg)  12/23/22 (!) 449 lb (203.7 kg)  08/05/21 (!) 436 lb (197.8 kg) (>99 %, Z= 4.05)*   * Growth percentiles are based on CDC (Boys, 2-20 Years) data.     GEN: Super morbid obesity  no acute distress HEENT: Normal NECK: No JVD CARDIAC: RRR, systolic murmur worse with Valsalva, no rubs, gallops RESPIRATORY:  Clear to auscultation without rales, wheezing or rhonchi  ABDOMEN: Soft, non-tender, non-distended MUSCULOSKELETAL:  +1 left leg edema; No deformity  SKIN: Warm and dry NEUROLOGIC:  Alert and oriented x 3 PSYCHIATRIC:  Normal affect   ASSESSMENT:    1.  Hypertrophic cardiomyopathy (HCC)   2. Mixed hyperlipidemia   3. DOE (dyspnea on exertion)    PLAN:     Hypertrophic Cardiomyopathy  - with pediatric diagnosis  - peak gradient 21 at [facility] - LVEF 70% today with no SAM related MR on beside echo (sitting); difficult windos - suspicion of Fabry's/Danon or other mimics of HCM: low - Gene variant: Pending  -  NYHA I - Non HCM Contributors to disease/status - morbid obesity: we have discussed GLP-1 therapy and the role of surgery  - HTN; if BP increases we will start him on Valsartan if no evidence of SAM  Family history Review, Discussed family screening  (Has two brothers and half brothers to monitor; will get genetics screen)  SCD  Assessment - unable to do POET due to weight, unable to do CMR due to weight - will get echo and ziopatch  Atrial fibrillation monitoring as above  Medications for sx: - will start lasix 20 mg PO as a PRN; I suspect he will need this for at least 4-5 days  If no issues with echo or POET:  Frequency: three days per week to start  Intensity:  60 to 90 percent of heart rate reserve. Time:  20 minutes per session with 5 min increase per session Type:  walking, cycle,  Volume:  goal of 60 minute sessions Step goal:  7000 per week with goal to increase by 500 each weak Progression:  gradually increase the intensity or duration of exercise. Limitations:  Limitation in burst activity (we used heavy weight lifting  Diet Prescription Type: Mediterranean- we discussed the data on the Mediterranean diet on symptoms burden in the HCM population Calorie goal: 2000 Weight loss goal if applicable: 1-2 lbs a week Limitations: he and his mom have different food habits, she cooks Meal plan: Created one day meal plan and gave online resources for weekly planning; patient is amenable to diet created   HLD - Lipids and Lp(a); lifestyle as above         Medication Adjustments/Labs and Tests  Ordered: Current medicines are reviewed at length with the patient today.  Concerns regarding medicines are outlined above.  Orders Placed This Encounter  Procedures   Lipid panel   Lipoprotein A (LPA)   Basic metabolic panel   LONG TERM MONITOR (3-14 DAYS)   ECHOCARDIOGRAM COMPLETE   Meds ordered this encounter  Medications   furosemide (LASIX) 20 MG tablet    Sig: Take 1 tablet (20 mg total) by mouth daily as needed (leg swelling).    Dispense:  30 tablet    Refill:  11   pantoprazole (PROTONIX) 40 MG tablet    Sig: Please see PCP for refills    Dispense:  90 tablet    Refill:  0    Patient Instructions  Medication Instructions:  Your physician has recommended you make the following change in your medication:  START: furosemide (Lasix) 20 mg by mouth once daily as needed for leg swelling  *If you need a refill on your cardiac medications before your next appointment, please call your pharmacy*   Lab Work: 1 WEEK: FLP, Lpa, BMP (nothing to eat or drink 8 hours before except water)  If you have labs (blood work) drawn today and your tests are completely normal, you will receive your results only by: MyChart Message (if you have MyChart) OR A paper copy in the mail If you have any lab test that is abnormal or we need to change your treatment, we will call you to review the results.   Testing/Procedures: Your physician has requested that you have an echocardiogram. Echocardiography is a painless test that uses sound waves to create images of your heart. It provides your doctor with information about the size and shape of your heart and how well your heart's chambers and valves are working. This procedure takes approximately one hour. There  are no restrictions for this procedure. Please do NOT wear cologne, perfume, aftershave, or lotions (deodorant is allowed). Please arrive 15 minutes prior to your appointment time.  Your physician has requested that you wear a heart  monitor.    Follow-Up: At [facility], you and your health needs are our priority.  As part of our continuing mission to provide you with exceptional heart care, we have created designated Provider Care Teams.  These Care Teams include your primary Cardiologist (physician) and Advanced Practice Providers (APPs -  Physician Assistants and Nurse Practitioners) who all work together to provide you with the care you need, when you need it.   Your next appointment:   1 year(s)  Provider:   Christell Constant, MD       OTHER INSTRUCTIONS ZIO XT- Long Term Monitor Instructions  Your physician has requested you wear a ZIO patch monitor for 14 days.  This is a single patch monitor. [employer] supplies one patch monitor per enrollment. Additional stickers are not available. Please do not apply patch if you will be having a Nuclear Stress Test,   Cardiac CT, MRI, or Chest Xray during the period you would be wearing the  monitor. The patch cannot be worn during these tests. You cannot remove and re-apply the  ZIO XT patch monitor.  Your ZIO patch monitor will be mailed 3 day USPS to your address on file. It may take 3-5 days  to receive your monitor after you have been enrolled.  Once you have received your monitor, please review the enclosed instructions. Your monitor  has already been registered assigning a specific monitor serial # to you.  Billing and Patient Assistance Program Information  We have supplied [employer] with any of your insurance information on file for billing purposes. [employer] offers a sliding scale Patient Assistance Program for patients that do not have  insurance, or whose insurance does not completely cover the cost of the ZIO monitor.  You must apply for the Patient Assistance Program to qualify for this discounted rate.  To apply, please call [employer] at [phone], select option 4, select option 2, ask to apply for  Patient Assistance Program. Meredeth Ide will  ask your household income, and how many people  are in your household. They will quote your out-of-pocket cost based on that information.  [employer] will also be able to set up a 58-month, interest-free payment plan if needed.  Applying the monitor   Shave hair from upper left chest.  Hold abrader disc by orange tab. Rub abrader in 40 strokes over the upper left chest as  indicated in your monitor instructions.  Clean area with 4 enclosed alcohol pads. Let dry.  Apply patch as indicated in monitor instructions. Patch will be placed under collarbone on left  side of chest with arrow pointing upward.  Rub patch adhesive wings for 2 minutes. Remove white label marked "1". Remove the white  label marked "2". Rub patch adhesive wings for 2 additional minutes.  While looking in a mirror, press and release button in center of patch. A small green light will  flash 3-4 times. This will be your only indicator that the monitor has been turned on.  Do not shower for the first 24 hours. You may shower after the first 24 hours.  Press the button if you feel a symptom. You will hear a small click. Record Date, Time and  Symptom in the Patient Logbook.  When you are ready to remove  the patch, follow instructions on the last 2 pages of Patient  Logbook. Stick patch monitor onto the last page of Patient Logbook.  Place Patient Logbook in the blue and white box. Use locking tab on box and tape box closed  securely. The blue and white box has prepaid postage on it. Please place it in the mailbox as  soon as possible. Your physician should have your test results approximately 7 days after the  monitor has been mailed back to [facility].  Call [facility] Customer Care at [phone] if you have questions regarding  your ZIO XT patch monitor. Call them immediately if you see an orange light blinking on your  monitor.  If your monitor falls off in less than 4 days, contact our Monitor department at  [phone].  If your monitor becomes loose or falls off after 4 days call [employer] at [phone] for  suggestions on securing your monitor     Signed, Christell Constant, MD  02/15/2023 3:34 PM    [facility]

## 2023-02-15 NOTE — Patient Instructions (Addendum)
Medication Instructions:  Your physician has recommended you make the following change in your medication:  START: furosemide (Lasix) 20 mg by mouth once daily as needed for leg swelling  *If you need a refill on your cardiac medications before your next appointment, please call your pharmacy*   Lab Work: 1 WEEK: FLP, Lpa, BMP (nothing to eat or drink 8 hours before except water)  If you have labs (blood work) drawn today and your tests are completely normal, you will receive your results only by: MyChart Message (if you have MyChart) OR A paper copy in the mail If you have any lab test that is abnormal or we need to change your treatment, we will call you to review the results.   Testing/Procedures: Your physician has requested that you have an echocardiogram. Echocardiography is a painless test that uses sound waves to create images of your heart. It provides your doctor with information about the size and shape of your heart and how well your heart's chambers and valves are working. This procedure takes approximately one hour. There are no restrictions for this procedure. Please do NOT wear cologne, perfume, aftershave, or lotions (deodorant is allowed). Please arrive 15 minutes prior to your appointment time.  Your physician has requested that you wear a heart monitor.    Follow-Up: At [facility], you and your health needs are our priority.  As part of our continuing mission to provide you with exceptional heart care, we have created designated Provider Care Teams.  These Care Teams include your primary Cardiologist (physician) and Advanced Practice Providers (APPs -  Physician Assistants and Nurse Practitioners) who all work together to provide you with the care you need, when you need it.   Your next appointment:   1 year(s)  Provider:   Christell Constant, MD       OTHER INSTRUCTIONS ZIO XT- Long Term Monitor Instructions  Your physician has requested you  wear a ZIO patch monitor for 14 days.  This is a single patch monitor. [employer] supplies one patch monitor per enrollment. Additional stickers are not available. Please do not apply patch if you will be having a Nuclear Stress Test,   Cardiac CT, MRI, or Chest Xray during the period you would be wearing the  monitor. The patch cannot be worn during these tests. You cannot remove and re-apply the  ZIO XT patch monitor.  Your ZIO patch monitor will be mailed 3 day USPS to your address on file. It may take 3-5 days  to receive your monitor after you have been enrolled.  Once you have received your monitor, please review the enclosed instructions. Your monitor  has already been registered assigning a specific monitor serial # to you.  Billing and Patient Assistance Program Information  We have supplied [employer] with any of your insurance information on file for billing purposes. [employer] offers a sliding scale Patient Assistance Program for patients that do not have  insurance, or whose insurance does not completely cover the cost of the ZIO monitor.  You must apply for the Patient Assistance Program to qualify for this discounted rate.  To apply, please call [employer] at [phone], select option 4, select option 2, ask to apply for  Patient Assistance Program. Meredeth Ide will ask your household income, and how many people  are in your household. They will quote your out-of-pocket cost based on that information.  [employer] will also be able to set up a 81-month, interest-free payment plan if needed.  Applying the monitor   Shave hair from upper left chest.  Hold abrader disc by orange tab. Rub abrader in 40 strokes over the upper left chest as  indicated in your monitor instructions.  Clean area with 4 enclosed alcohol pads. Let dry.  Apply patch as indicated in monitor instructions. Patch will be placed under collarbone on left  side of chest with arrow pointing upward.  Rub patch adhesive wings  for 2 minutes. Remove white label marked "1". Remove the white  label marked "2". Rub patch adhesive wings for 2 additional minutes.  While looking in a mirror, press and release button in center of patch. A small green light will  flash 3-4 times. This will be your only indicator that the monitor has been turned on.  Do not shower for the first 24 hours. You may shower after the first 24 hours.  Press the button if you feel a symptom. You will hear a small click. Record Date, Time and  Symptom in the Patient Logbook.  When you are ready to remove the patch, follow instructions on the last 2 pages of Patient  Logbook. Stick patch monitor onto the last page of Patient Logbook.  Place Patient Logbook in the blue and white box. Use locking tab on box and tape box closed  securely. The blue and white box has prepaid postage on it. Please place it in the mailbox as  soon as possible. Your physician should have your test results approximately 7 days after the  monitor has been mailed back to [facility].  Call [facility] Customer Care at [phone] if you have questions regarding  your ZIO XT patch monitor. Call them immediately if you see an orange light blinking on your  monitor.  If your monitor falls off in less than 4 days, contact our Monitor department at [phone].  If your monitor becomes loose or falls off after 4 days call [employer] at [phone] for  suggestions on securing your monitor

## 2023-02-16 ENCOUNTER — Telehealth: Payer: Self-pay | Admitting: Licensed Clinical Social Worker

## 2023-02-16 ENCOUNTER — Ambulatory Visit: Payer: Managed Care, Other (non HMO) | Admitting: Internal Medicine

## 2023-02-16 NOTE — Telephone Encounter (Signed)
H&V Care Navigation CSW Progress Note  Clinical Social Worker contacted patient by phone to f/u on transportation support. LCSW left voicemail at [phone]. Due to commercial plan only pt does not have transportation benefits. Our team can connect him with [employer] for general transportation and if needed transportation to heart and vascular appointments.  Patient is participating in a Managed Medicaid Plan:  No, commercial plan only  SDOH Screenings   Depression (PHQ2-9): Medium Risk (12/23/2022)  Tobacco Use: Medium Risk (02/15/2023)   Devin Marshall, MSW, LCSW Clinical Social Worker II [facility] Heart/Vascular Care Navigation  [phone]- work cell phone (preferred) [phone]- desk phone

## 2023-02-17 ENCOUNTER — Telehealth: Payer: Self-pay | Admitting: Licensed Clinical Social Worker

## 2023-02-17 NOTE — Telephone Encounter (Signed)
H&V Care Navigation CSW Progress Note  Clinical Social Worker contacted patient by phone to f/u on transportation support. LCSW left voicemail again at [phone]. Due to commercial plan only pt does not have transportation benefits. Our team can connect him with [employer] for general transportation and if needed transportation to heart and vascular appointments. Will mail pt [employer] application   Patient is participating in a Managed Medicaid Plan:  No, commercial plan only  SDOH Screenings   Depression (PHQ2-9): Medium Risk (12/23/2022)  Tobacco Use: Medium Risk (02/15/2023)   Devin Marshall, MSW, LCSW Clinical Social Worker II [facility] Heart/Vascular Care Navigation  [phone]- work cell phone (preferred) [phone]- desk phone

## 2023-02-20 NOTE — Progress Notes (Unsigned)
Pre Test Genetic Consult  Referral Reason  Devin Marshall is referred for genetic consult and testing of hypertrophic cardiomyopathy.  Personal Medical Information Devin Marshall (III.1 on pedigree) is a 52 African American man who is here today with his mother, Devin Marshall. At age 22, he had an abnormal a blood test when he enrolled in a healthy weight and wellness clinic that led to his referral to Dr. Glenetta Hew at [facility]. Cardiac imaging studies detected cardiac wall thickness suggestive of HCM. A recent echocardiogram revealed moderate to severe concentric LVH. He has not yet had an MRI due to his large size.  He has since transitioned from [facility] to Adult cardiology and is here today to establish care at the [facility] clinic.  His mother reports that he had a murmur at birth that has not resolved. She recollects that he would tell her that his heart was beating very hard while playing at [employer]. He now reports having dyspnea, chest discomfort that worsens after a meal, heart palpitations and dizziness. Denies having syncope. He also reports having pedal edema in the left leg since March 2024.  Traditional Risk Factors Devin Marshall tells me that he has been taking metoprolol since the time he was found to have HCM and his blood pressure is usually within normal limits.  Family history There is no report of HCM or sudden death in his family.  Relation to Proband Pedigree # Current age Heart condition/age of onset Notes  Brother III.2 14 None Echo/EKG not done        Father II.5 61 None Echo/EKG not done  Paternal uncles II.1, II.3 II.1-Deceased, 73 None II.1- died @ 12 from HIV complications  Paternal aunts II.2, II.4 II.2-Deceased, 64 None II.2- Died of liver cancer @ 32  Paternal grandfather 1.1 Deceased None Died of cancer @ 46  Paternal grandmother I.2 Deceased Cardiac issues- details? Died @ 60 from poor health due to diabetes and cardiac issues.        Mother II.6 30 None Normal  Echo/EKG @ 18  Maternal grandfather I.3 52 None   Maternal grandmother I.4 Deceased None Died @ 84 from lung cancer    Genetics Umair was counseled on the genetics of hypertrophic cardiomyopathy (HCM). I explained to the patient that this is an autosomal dominant condition with incomplete penetrance i.e. not all individuals harboring the HCM mutation will present clinically with HCM, and age-related penetrance where clinical presentation of HCM increases with advanced age.   Since HCM is an autosomal dominant condition, first degree-relatives, namely parents and brother should seek regular surveillance for HCM Clinical screening involves echocardiogram and EKG at regular intervals, frequency is typically determined by age, with those in their teens undergoing screening every year until the age of 78 and those over the age of 47 getting screened every 3-5 years. Patient verbalized understanding of this.  I informed the patient that about 8-10% of HCM patients can have compound and digenic mutations for HCM. Also briefly discussed the inheritance pattern and treatment /management plans for the infiltrative cardiomyopathies that present as HCM phenocopies.   We walked through the process of genetic testing. I explained to the patient that genetic testing is a probabilistic test dependent upon age and severity of presentation, presence of risk factors for HCM and importantly family history of HCM or sudden death in first-degree relatives.   The potential outcomes of genetic testing and subsequent management of at-risk family members were discussed so as to manage expectations-  If  a mutation is not identified, then all first-degree relatives should undergo regular screening for HCM. I emphasized that even if the genetic test is negative, it does not mean that he does not have HCM. A negative test result can be due to limitations of the genetic test. Patient verbalized understanding of this.  There is  also the likelihood of identifying a "Variant of unknown significance". This result means that the variant has not been detected in a statistically significant number of HCM patients and/or functional studies have not been performed to verify its pathogenicity. This VUS can be tested in the family to see if it segregates with disease. If a VUS is found, first-degree relatives should undergo regular clinical screening for HCM.  If a pathogenic variant is reported, then first-degree family members can get tested for this variant. If they test positive, it is likely they will develop HCM. In light of variable expression and incomplete penetrance associated with HCM, it is not possible to predict when they will manifest clinically with HCM. It is recommended that family members that test positive for the familial pathogenic variant pursue clinical screening for HCM. Family members that test negative for the familial mutation need not pursue periodic screening for HCM, but seek care if symptoms develop.   Impression  Devin Marshall was found to have cardiac wall thickness suggestive of HCM at age 57. However, there is no family history of HCM. Considering his early age and severity of clinical presentation, genetic testing for HCM is recommended. This test should include the sarcomeric genes implicated in HCM as well as the genes for the HCM phenocopies, such as Fabry disease, Danon disease, WPW syndrome and familial transthyretin amyloidosis.  In addition, patient should be aware of the protections afforded by the Genetic Information Non-Discrimination Act (GINA). GINA protects them from losing their employment or health insurance based on their genotype. However, these protections do not cover life insurance and disability.   Please note that the patient has not been counseled in this visit on other personal, cultural or ethical issues that he may face due to his heart condition.   Plan After a thorough discussion  of the risk and benefits of genetic testing for HCM, Travell states his intent to pursue genetic testing for HCM and signed the informed consent form. Blood was drawn today for testing.     Sidney Ace, Ph.D, [facility] Clinical Molecular Geneticist

## 2023-02-23 ENCOUNTER — Ambulatory Visit: Payer: Managed Care, Other (non HMO) | Attending: Internal Medicine

## 2023-02-28 ENCOUNTER — Telehealth: Payer: Self-pay | Admitting: Licensed Clinical Social Worker

## 2023-02-28 NOTE — Telephone Encounter (Signed)
H&V Care Navigation CSW Progress Note  Clinical Social Worker  received call from pt mother  to f/u on my calls. DPR on file for Devin Marshall, [phone]. Introduced self, role, reason for call. Confirmed pt need for transportation support. She is unsure if they received other applications/information. Requests I mail it again with her name on it since pt may have put in his room without looking. May need ride to upcoming appt on 7/19, requested she review resources and help pt apply for [employer]- and call if still needed on 7/15. I also encouraged her to see if pt may be eligible for Medicaid as that has free transportation benefits. Pt mother works at [employer] so she will speak to caseworker and I also updated her about walk in clinic at [facility].   Patient is participating in a Managed Medicaid Plan: no, [place] only  SDOH Screenings   Transportation Needs: Unmet Transportation Needs (02/28/2023)  Depression (PHQ2-9): Medium Risk (12/23/2022)  Tobacco Use: Medium Risk (02/15/2023)   Octavio Graves, MSW, LCSW Clinical Social Worker II [facility] Health Heart/Vascular Care Navigation  [phone]- work cell phone (preferred) [phone]- desk phone

## 2023-03-17 ENCOUNTER — Telehealth: Payer: Self-pay

## 2023-03-17 ENCOUNTER — Telehealth: Payer: Self-pay | Admitting: Pharmacist

## 2023-03-17 ENCOUNTER — Ambulatory Visit (HOSPITAL_COMMUNITY): Payer: Managed Care, Other (non HMO) | Attending: Internal Medicine

## 2023-03-17 DIAGNOSIS — I422 Other hypertrophic cardiomyopathy: Secondary | ICD-10-CM | POA: Insufficient documentation

## 2023-03-17 DIAGNOSIS — E782 Mixed hyperlipidemia: Secondary | ICD-10-CM | POA: Diagnosis not present

## 2023-03-17 LAB — ECHOCARDIOGRAM COMPLETE
Area-P 1/2: 4.29 cm2
S' Lateral: 2.5 cm

## 2023-03-17 MED ORDER — VALSARTAN 40 MG PO TABS: 40.0000 mg | ORAL_TABLET | Freq: Every day | ORAL | 3 refills | Status: DC

## 2023-03-17 MED ORDER — PERFLUTREN LIPID MICROSPHERE
1.0000 mL | INTRAVENOUS | Status: AC | PRN
  Administered 2023-03-17: 3 mL via INTRAVENOUS

## 2023-03-17 NOTE — Telephone Encounter (Signed)
Christell Constant, MD  Dulce Sellar, NP Cc: Macie Burows, RN; P Rx [facility] Results: Septal thickness 27 mm LVEF 65% LVOT color acceleration with color flow SAM but not significant gradient Plan: Echo in one year Valsartan 40 mg PO Daily and BP checks at home Coverage check for [place], [place]

## 2023-03-17 NOTE — Telephone Encounter (Signed)
No hx of DM, has Nurse, learning disability. Will submit coverage for tirzepatide - Zepbound for weight loss indication. Key [id number], waiting for clinical questions to populate.

## 2023-03-17 NOTE — Telephone Encounter (Signed)
Pharmacy is working on getting pt approved for weight loss medication.   Advised mother to have pt check BP daily for at least 2 weeks d/t valsartan being added to medication list.

## 2023-03-17 NOTE — Telephone Encounter (Signed)
-----   Message from Christell Constant sent at 03/17/2023  2:17 PM EDT ----- Results: Septal thickness 27 mm LVEF 65% LVOT color acceleration with color flow SAM but not significant gradient Plan: Echo in one year Valsartan 40 mg PO Daily and BP checks at home Coverage check for [place], [place]

## 2023-03-20 NOTE — Telephone Encounter (Signed)
Request has been submitted

## 2023-03-27 ENCOUNTER — Telehealth: Payer: Self-pay

## 2023-03-27 ENCOUNTER — Other Ambulatory Visit (HOSPITAL_COMMUNITY): Payer: Self-pay

## 2023-03-27 MED ORDER — ZEPBOUND 2.5 MG/0.5ML ~~LOC~~ SOAJ: 2.5000 mg | SUBCUTANEOUS | 0 refills | Status: DC

## 2023-03-27 NOTE — Telephone Encounter (Signed)
   PER TEST CLAIM THE COST FOR FOR ZEPBOUND IS AROUND/ABOUT 360.79

## 2023-03-27 NOTE — Addendum Note (Signed)
Addended by: Dawsyn Ramsaran E on: 03/27/2023 03:30 PM   Modules accepted: Orders

## 2023-03-27 NOTE — Telephone Encounter (Signed)
Haven't heard back on Zepbound prior auth, however tech was able to run test claim and rx processed for ~$360/month. Would be able to use copay card in addition to this to reduce cost. Called pt to discuss, spoke with his mom. He is eager to try med. Provided her with website to activate $25 copay card. Advised to store med in the fridge and can bring in to appt I scheduled this Friday to discuss GLP start. Of note, pt's mom has taken Ozempic in the past and also experienced constipation on it.

## 2023-03-27 NOTE — Telephone Encounter (Deleted)
Haven't heard back on Zepbound prior auth, however tech was able to run test claim and rx processed for ~$360/month. Would be able to use copay card in addition to this to reduce cost. Called pt to discuss, spoke with his mom. He is eager to try med. Provided her with website to activate $25 copay card. Advised to store med in the fridge and can bring in to appt I scheduled this Friday to discuss GLP start. Of note, pt's mom has taken Ozempic in the past and also experienced constipation on it.

## 2023-03-31 ENCOUNTER — Ambulatory Visit: Payer: Managed Care, Other (non HMO) | Attending: Cardiology | Admitting: Pharmacist

## 2023-03-31 NOTE — Progress Notes (Signed)
Patient ID: Devin Marshall                 DOB: [birth year]                    MRN: [id number]     HPI: Devin Marshall is a 22 y.o. male patient referred to pharmacy clinic by Dr Izora Ribas to initiate GLP1-RA therapy. PMH is significant for hypertrophic cardiomyopathy, obesity, anxiety/depression, sleep apnea, asthma, and prior eating disorder. Most recent BMI 72.  Zepbound is covered on [employer] plan, pt set up with $25 copay card. Presents today with his mother for medication and injection education. He has not picked up rx yet, isn't sure if copay card has been activated.  Diet:  Breakfast: sausage or pancakes Lunch: sandwiches Dinner: mom cooks - chicken, burgers Drinks: water, sweet tea Snacks: not much  Exercise: walking 15-25 mins  Family History: Paternal grandfather with MI with ICD placed post MI in late 67s. Asthma in his brother and mother; Diabetes in his mother and paternal grandmother; Diabetes type I in his brother; Drug abuse in his father; Hyperlipidemia in his mother; Hypertension in his maternal grandmother and mother; Obesity in his mother; Sleep apnea in his brother and mother.   Social History: Passive tobacco exposure (both parents smoke), no alcohol or drug use.  Labs: Lab Results  Component Value Date   HGBA1C 5.2 10/12/2017    Wt Readings from Last 1 Encounters:  02/15/23 (!) 456 lb (206.8 kg)    BP Readings from Last 1 Encounters:  02/15/23 138/78   Pulse Readings from Last 1 Encounters:  02/15/23 92       Component Value Date/Time   CHOL 171 (H) 10/12/2017 1142   TRIG 72 10/12/2017 1142   HDL 33 (L) 10/12/2017 1142   CHOLHDL 5.8 (H) 06/18/2015 1612   VLDL 20 06/18/2015 1612   LDLCALC 124 (H) 10/12/2017 1142    Past Medical History:  Diagnosis Date   ADHD (attention deficit hyperactivity disorder)    Allergy    Anxiety    Asthma    Asthma    Phreesia 01/28/2020   BMI (body mass index), pediatric, > 99% for age  42/05/2015   Conjunctivitis 05/08/2013   Depression    Eating disorder    Eczema    Encounter for routine child health examination without abnormal findings 01/30/2020   Morbid obesity (HCC)    Obesity    Situational anxiety    Sleep apnea    Undiagnosed cardiac murmurs 12/10/2012    Current Outpatient Medications on File Prior to Visit  Medication Sig Dispense Refill   albuterol (PROVENTIL) (2.5 MG/3ML) 0.083% nebulizer solution Take 3 mLs (2.5 mg total) by nebulization every 4 (four) hours as needed for wheezing or shortness of breath. 75 mL 12   budesonide (PULMICORT) 0.5 MG/2ML nebulizer solution TAKE 2 MLS (0.5 MG TOTAL) BY NEBULIZATION 2 (TWO) TIMES DAILY. (Patient taking differently: Take 0.5 mg by nebulization 2 (two) times daily as needed (shortness of breath).) 360 mL 4   buPROPion (WELLBUTRIN XL) 150 MG 24 hr tablet Take 150 mg by mouth daily.     fluticasone (FLOVENT HFA) 110 MCG/ACT inhaler Inhale 2 puffs into the lungs 2 (two) times daily. 1 Inhaler 12   furosemide (LASIX) 20 MG tablet Take 1 tablet (20 mg total) by mouth daily as needed (leg swelling). 30 tablet 11   levalbuterol (XOPENEX HFA) 45 MCG/ACT inhaler Inhale 1-2 puffs into the lungs every  4 (four) hours as needed for wheezing. 15 g 0   metoprolol succinate (TOPROL-XL) 50 MG 24 hr tablet Take 75 mg by mouth daily. Take 1 pill in am and 1/2 pill qhs.     pantoprazole (PROTONIX) 40 MG tablet Please see PCP for refills 90 tablet 0   valsartan (DIOVAN) 40 MG tablet Take 1 tablet (40 mg total) by mouth daily. Please monitor BP 90 tablet 3   ZEPBOUND 2.5 MG/0.5ML Pen Inject 2.5 mg into the skin once a week. 2 mL 0   No current facility-administered medications on file prior to visit.    Allergies  Allergen Reactions   Other Itching    Had a reaction to flu shot in 2009, 2010 and 2011 had flu mist with no reaction then 2012 had flu shot again and developed itching and redness     Assessment/Plan:  1. Weight  loss - Patient has not met goal of at least 5% of body weight loss with comprehensive lifestyle modifications alone in the past 3-6 months. Pharmacotherapy is appropriate to pursue as augmentation. Will start Zepbound 2.5mg  SQ once weekly. No personal or family history of medullary thyroid carcinoma (MTC) or Multiple Endocrine Neoplasia syndrome type 2 (MEN 2). Injection technique reviewed at today's visit.  Advised patient on common side effects including nausea, diarrhea, dyspepsia, decreased appetite, and fatigue. Counseled patient on reducing meal size and how to titrate medication to minimize side effects. Counseled patient to call if intolerable side effects or if experiencing dehydration, abdominal pain, or dizziness. Patient will adhere to dietary modifications and will target at least 150 minutes of moderate intensity exercise weekly. Encouraged him to stop drinking sweet tea.  Activated $25 copay card for pt today: RXBIN: [id number] PCN: [id number] GRP: [id number] ID: [id number]  Follow up in 1 month via telephone for tolerability update and dose titration.  Devin Marshall E. Chandra Feger, PharmD, BCACP, CPP [facility] [address], [place], [place] [address] Phone: [phone]; Fax: [phone] 03/31/2023 11:27 AM

## 2023-03-31 NOTE — Patient Instructions (Addendum)
Zepbound Counseling Points This medication reduces your appetite and may make you feel fuller longer.  Stop eating when your body tells you that you are full. This will likely happen sooner than you are used to. Fried/greasy food and sweets may upset your stomach - minimize these as much as possible. Store your medication in the fridge until you are ready to use it. Inject your medication in the fatty tissue of your lower abdominal area (2 inches away from belly button) or upper outer thigh. Rotate injection sites. Common side effects include: nausea, diarrhea/constipation, and heartburn, and are more likely to occur if you overeat. Stop your injection for 7 days prior to surgical procedures requiring anesthesia.  I will call you monthly to titrate your dose  Change to unsweet tea!  Tips for success: Write down the reasons why you want to lose weight and post it in a place where you'll see it often.  Start small and work your way up. Keep in mind that it takes time to achieve goals, and small steps add up.  Any additional movements help to burn calories. Taking the stairs rather than the elevator and parking at the far end of your parking lot are easy ways to start. Brisk walking for at least 30 minutes 4 or more days of the week is an excellent goal to work toward  Understanding what it means to feel full: Did you know that it can take 15 minutes or more for your brain to receive the message that you've eaten? That means that, if you eat less food, but consume it slower, you may still feel satisfied.  Eating a lot of fruits and vegetables can also help you feel fuller.  Eat off of smaller plates so that moderate portions don't seem too small  Tips for living a healthier life     Building a Healthy and Balanced Diet Make most of your meal vegetables and fruits -  of your plate. Aim for color and variety, and remember that potatoes don't count as vegetables on the Healthy Eating  Plate because of their negative impact on blood sugar.  Go for whole grains -  of your plate. Whole and intact grains--whole wheat, barley, wheat berries, quinoa, oats, brown rice, and foods made with them, such as whole wheat pasta--have a milder effect on blood sugar and insulin than white bread, white rice, and other refined grains.  Protein power -  of your plate. Fish, poultry, beans, and nuts are all healthy, versatile protein sources--they can be mixed into salads, and pair well with vegetables on a plate. Limit red meat, and avoid processed meats such as bacon and sausage.  Healthy plant oils - in moderation. Choose healthy vegetable oils like olive, canola, soy, corn, sunflower, peanut, and others, and avoid partially hydrogenated oils, which contain unhealthy trans fats. Remember that low-fat does not mean "healthy."  Drink water, coffee, or tea. Skip sugary drinks, limit milk and dairy products to one to two servings per day, and limit juice to a small glass per day.  Stay active. The red figure running across the Healthy Eating Plate's placemat is a reminder that staying active is also important in weight control.  The main message of the Healthy Eating Plate is to focus on diet quality:  The type of carbohydrate in the diet is more important than the amount of carbohydrate in the diet, because some sources of carbohydrate--like vegetables (other than potatoes), fruits, whole grains, and beans--are healthier than others.  The Healthy Eating Plate also advises consumers to avoid sugary beverages, a major source of calories--usually with little nutritional value--in the American diet. The Healthy Eating Plate encourages consumers to use healthy oils, and it does not set a maximum on the percentage of calories people should get each day from healthy sources of fat. In this way, the Healthy Eating Plate recommends the opposite of the low-fat message promoted for decades by the  USDA.  [employer]  SUGAR  Sugar is a huge problem in the modern day diet. Sugar is a big contributor to heart disease, diabetes, high triglyceride levels, fatty liver disease and obesity. Sugar is hidden in almost all packaged foods/beverages. Added sugar is extra sugar that is added beyond what is naturally found and has no nutritional benefit for your body. The American Heart Association recommends limiting added sugars to no more than 25g for women and 36 grams for men per day. There are many names for sugar including maltose, sucrose (names ending in "ose"), high fructose corn syrup, molasses, cane sugar, corn sweetener, raw sugar, syrup, honey or fruit juice concentrate.   One of the best ways to limit your added sugars is to stop drinking sweetened beverages such as soda, sweet tea, and fruit juice.  There is 65g of added sugars in one 20oz bottle of Coke! That is equal to 7.5 donuts.   Pay attention and read all nutrition facts labels. Below is an examples of a nutrition facts label. The #1 is showing you the total sugars where the # 2 is showing you the added sugars. This one serving has almost the max amount of added sugars per day!   EXERCISE  Exercise is good. We've all heard that. In an ideal world, we would all have time and resources to get plenty of it. When you are active, your heart pumps more efficiently and you will feel better.  Multiple studies show that even walking regularly has benefits that include living a longer life. The American Heart Association recommends 150 minutes per week of exercise (30 minutes per day most days of the week). You can do this in any increment you wish. Nine or more 10-minute walks count. So does an hour-long exercise class. Break the time apart into what will work in your life. Some of the best things you can do include walking briskly, jogging, cycling or swimming laps. Not everyone is ready  to "exercise." Sometimes we need to start with just getting active. Here are some easy ways to be more active throughout the day:  Take the stairs instead of the elevator  Go for a 10-15 minute walk during your lunch break (find a friend to make it more enjoyable)  When shopping, park at the back of the parking lot  If you take public transportation, get off one stop early and walk the extra distance  Pace around while making phone calls  Check with your doctor if you aren't sure what your limitations may be. Always remember to drink plenty of water when doing any type of exercise. Don't feel like a failure if you're not getting the 90-150 minutes per week. If you started by being a couch potato, then just a 10-minute walk each day is a huge improvement. Start with little victories and work your way up.   HEALTHY EATING TIPS              Plan ahead: make a menu of the meals for a week then create a grocery  list to go with that menu. Consider meals that easily stretch into a night of leftovers, such as stews or casseroles. Or consider making two of your favorite meal and put one in the freezer for another night. Try a night or two each week that is "meatless" or "no cook" such as salads. When you get home from the grocery store wash and prepare your vegetables and fruits. Then when you need them they are ready to go.   Tips for going to the grocery store:  Buy store or generic brands  Check the weekly ad from your store on-line or in their in-store flyer  Look at the unit price on the shelf tag to compare/contrast the costs of different items  Buy fruits/vegetables in season  Carrots, bananas and apples are low-cost, naturally healthy items  If meats or frozen vegetables are on sale, buy some extras and put in your freezer  Limit buying prepared or "ready to eat" items, even if they are pre-made salads or fruit snacks  Do not shop when you're hungry  Foods at eye level tend to be more expensive.  Look on the high and low shelves for deals.  Consider shopping at the farmer's market for fresh foods in season.  Avoid the cookie and chip aisles (these are expensive, high in calories and low in nutritional value). Shop on the outside of the grocery store.  Healthy food preparations:  If you can't get lean hamburger, be sure to drain the fat when cooking  Steam, saut (in olive oil), grill or bake foods  Experiment with different seasonings to avoid adding salt to your foods. Kosher salt, sea salt and Himalayan salt are all still salt and should be avoided. Try seasoning food with onion, garlic, thyme, rosemary, basil ect. Onion powder or garlic powder is ok. Avoid if it says salt (ie garlic salt).

## 2023-04-24 ENCOUNTER — Telehealth: Payer: Self-pay | Admitting: Pharmacist

## 2023-04-24 MED ORDER — ZEPBOUND 5 MG/0.5ML ~~LOC~~ SOAJ: 5.0000 mg | SUBCUTANEOUS | 0 refills | Status: DC

## 2023-04-24 NOTE — Telephone Encounter (Signed)
Called pt to follow up with Zepbound tolerability. Pt reports tolerating med well, hasn't noticed change in appetite or weight yet. Will increase dose to 5mg  for next month.

## 2023-04-25 ENCOUNTER — Other Ambulatory Visit (HOSPITAL_COMMUNITY): Payer: Self-pay

## 2023-04-25 ENCOUNTER — Telehealth: Payer: Self-pay

## 2023-04-25 NOTE — Telephone Encounter (Signed)
Pharmacy Patient Advocate Encounter   Received notification from CoverMyMeds that prior authorization for ZEPBOUND is required/requested.   Insurance verification completed.   The patient is insured through [facility] .   Per test claim: PA required; PA submitted to [facility] via CoverMyMeds Key/confirmation #/EOC [id number] Status is pending

## 2023-04-26 ENCOUNTER — Other Ambulatory Visit: Payer: Self-pay

## 2023-04-26 ENCOUNTER — Other Ambulatory Visit (HOSPITAL_COMMUNITY): Payer: Self-pay

## 2023-04-26 DIAGNOSIS — I422 Other hypertrophic cardiomyopathy: Secondary | ICD-10-CM

## 2023-04-26 NOTE — Telephone Encounter (Signed)
Pharmacy Patient Advocate Encounter  Received notification from [facility] that Prior Authorization for ZEPBOUND 5 MG has been APPROVED from 04/26/23 to 12/25/23

## 2023-04-26 NOTE — Progress Notes (Signed)
Order placed for HCM cohesion testing.

## 2023-05-09 ENCOUNTER — Encounter: Payer: Self-pay | Admitting: Pediatrics

## 2023-05-22 ENCOUNTER — Telehealth: Payer: Self-pay | Admitting: Pharmacist

## 2023-05-22 NOTE — Telephone Encounter (Signed)
Called pt to follow up with Zepbound tolerability and potential dose increase. Left message for pt.

## 2023-05-24 ENCOUNTER — Other Ambulatory Visit (HOSPITAL_COMMUNITY): Payer: Self-pay

## 2023-05-24 ENCOUNTER — Encounter: Payer: Self-pay | Admitting: Pharmacist

## 2023-05-24 MED ORDER — ZEPBOUND 5 MG/0.5ML ~~LOC~~ SOAJ
5.0000 mg | SUBCUTANEOUS | 0 refills | Status: DC
  Filled 2023-05-24 – 2023-05-30 (×2): qty 2, 28d supply, fill #0

## 2023-05-24 NOTE — Telephone Encounter (Signed)
Called pt again, spoke with his mother who states he's been off the med for a few weeks because he hasn't been able to get it from the pharmacy. Thinks his insurance only contracts with [facility]. Will try sending in to [facility] pharmacy to see if they can process. Resent copay card info in [place] message per her request. Reports med was working when he was taking it, but since being off of it has noticed an increase in his appetite again. Advised them to let me know if there is an issue getting med filled.

## 2023-05-30 ENCOUNTER — Other Ambulatory Visit (HOSPITAL_COMMUNITY): Payer: Self-pay

## 2023-05-30 ENCOUNTER — Telehealth: Payer: Self-pay | Admitting: Internal Medicine

## 2023-05-30 ENCOUNTER — Telehealth: Payer: Self-pay | Admitting: Pharmacy Technician

## 2023-05-30 NOTE — Telephone Encounter (Signed)
Pt's mother notified med does not require prior auth and it's just too soon to refill. She states [facility] 2.5[facility]  dose on [facility]/20. Advised her that the 5mg  dose I sent in to [facility] on 9/25 can be processed on 10/11. Will touch base a month after that for dose increase.

## 2023-05-30 NOTE — Telephone Encounter (Signed)
Pt c/o medication issue:  1. Name of Medication:   tirzepatide (ZEPBOUND) 5 MG/0.5ML Pen   2. How are you currently taking this medication (dosage and times per day)?   As prescribed  3. Are you having a reaction (difficulty breathing--STAT)?   No  4. What is your medication issue?   Mother [name]) stated patient will need to get prior authorization to get this medication. Mother noted patient recently had medication increased to 1.5 mg.

## 2023-05-30 NOTE — Telephone Encounter (Signed)
Pharmacy Patient Advocate Encounter   Received notification from Pt Calls Messages that prior authorization for zepbound is required/requested.   Insurance verification completed.   The patient is insured through [facility] ADV[employer] .   Per test claim: Refill too soon. PA is not needed at this time. Medication was filled 05/19/23. Next eligible fill date is 06/09/23. Prior auth on file expires 12/25/23

## 2023-06-19 ENCOUNTER — Other Ambulatory Visit: Payer: Self-pay | Admitting: Family

## 2023-06-22 ENCOUNTER — Telehealth: Payer: Self-pay | Admitting: Internal Medicine

## 2023-06-22 MED ORDER — METOPROLOL SUCCINATE ER 50 MG PO TB24: 75.0000 mg | ORAL_TABLET | Freq: Every day | ORAL | 3 refills | Status: DC

## 2023-06-22 NOTE — Telephone Encounter (Signed)
*  STAT* If patient is at the pharmacy, call can be transferred to refill team.   1. Which medications need to be refilled? (please list name of each medication and dose if known)   metoprolol succinate (TOPROL-XL) 50 MG 24 hr tablet    2. Which pharmacy/location (including street and city if local pharmacy) is medication to be sent to? [id number] [place], [place] - [address] Phone: [phone]  Fax: [phone]      3. Do they need a 30 day or 90 day supply? 90 Patient has been out of med's for the last 3 days

## 2023-07-05 ENCOUNTER — Encounter: Payer: Self-pay | Admitting: Pharmacist

## 2023-07-05 ENCOUNTER — Other Ambulatory Visit: Payer: Self-pay | Admitting: Internal Medicine

## 2023-07-12 ENCOUNTER — Other Ambulatory Visit (HOSPITAL_COMMUNITY): Payer: Self-pay

## 2023-07-12 ENCOUNTER — Telehealth: Payer: Self-pay | Admitting: Internal Medicine

## 2023-07-12 MED ORDER — ZEPBOUND 7.5 MG/0.5ML ~~LOC~~ SOAJ
7.5000 mg | SUBCUTANEOUS | 0 refills | Status: DC
  Filled 2023-07-12: qty 2, 28d supply, fill #0

## 2023-07-12 NOTE — Telephone Encounter (Signed)
Per DRP spoke to mother, reports he is tolerating 5 mg Zepbound well. She does not know his current weight but noticed his apatite went down.  Will send Zepbound 7.5 mg to [facility]

## 2023-07-12 NOTE — Telephone Encounter (Signed)
*  STAT* If patient is at the pharmacy, call can be transferred to refill team.   1. Which medications need to be refilled? (please list name of each medication and dose if known)  buPROPion (WELLBUTRIN XL) 150 MG 24 hr tablet; tirzepatide (ZEPBOUND) 5 MG/0.5ML Pen      2. Would you like to learn more about the convenience, safety, & potential cost savings by using the [facility]? No      3. Are you open to using the [facility] (Type [facility]. No  ).   4. Which pharmacy/location (including street and city if local pharmacy) is medication to be sent to?[id number] [place] [place] - [address]    5. Do they need a 30 day or 90 day supply? 90

## 2023-07-14 NOTE — Telephone Encounter (Signed)
Spoke with pt mom per DPR. She is ok with rx at [facility] long and is ok with the 7.5mg . She says it is a lot cheaper there and she may transfer her rx as well.

## 2023-08-07 ENCOUNTER — Other Ambulatory Visit (HOSPITAL_COMMUNITY): Payer: Self-pay

## 2023-08-07 ENCOUNTER — Telehealth: Payer: Self-pay | Admitting: Pharmacist

## 2023-08-07 MED ORDER — ZEPBOUND 10 MG/0.5ML ~~LOC~~ SOAJ
10.0000 mg | SUBCUTANEOUS | 0 refills | Status: DC
  Filled 2023-08-07 – 2023-09-27 (×2): qty 2, 28d supply, fill #0

## 2023-08-07 NOTE — Telephone Encounter (Signed)
Per DRP spoke to mother, reports he is tolerating 7.5 mg Zepbound well. She does not know his current weight but noticed his apatite went down and weight has gone down a bit too. Lately he exercises too.  Will send Zepbound 10 mg to [facility]

## 2023-08-22 ENCOUNTER — Other Ambulatory Visit (HOSPITAL_COMMUNITY): Payer: Self-pay

## 2023-08-24 ENCOUNTER — Other Ambulatory Visit (HOSPITAL_COMMUNITY): Payer: Self-pay

## 2023-09-06 ENCOUNTER — Telehealth: Payer: Self-pay | Admitting: Pharmacist

## 2023-09-06 ENCOUNTER — Encounter: Payer: Self-pay | Admitting: Family

## 2023-09-06 ENCOUNTER — Ambulatory Visit: Payer: Managed Care, Other (non HMO) | Admitting: Family

## 2023-09-06 VITALS — BP 118/79 | HR 104 | Temp 98.0°F | Ht 67.0 in | Wt >= 6400 oz

## 2023-09-06 DIAGNOSIS — J4531 Mild persistent asthma with (acute) exacerbation: Secondary | ICD-10-CM | POA: Diagnosis not present

## 2023-09-06 DIAGNOSIS — J029 Acute pharyngitis, unspecified: Secondary | ICD-10-CM | POA: Diagnosis not present

## 2023-09-06 LAB — POC COVID19 BINAXNOW: SARS Coronavirus 2 Ag: NEGATIVE

## 2023-09-06 LAB — POCT INFLUENZA A/B
Influenza A, POC: NEGATIVE
Influenza B, POC: NEGATIVE

## 2023-09-06 LAB — POCT RAPID STREP A (OFFICE): Rapid Strep A Screen: NEGATIVE

## 2023-09-06 MED ORDER — BUDESONIDE 1 MG/2ML IN SUSP: 1.0000 mg | Freq: Every day | RESPIRATORY_TRACT | 12 refills | Status: DC

## 2023-09-06 MED ORDER — PREDNISONE 20 MG PO TABS
ORAL_TABLET | ORAL | 0 refills | Status: DC
Start: 2023-09-07 — End: 2023-09-12

## 2023-09-06 NOTE — Telephone Encounter (Signed)
 Due to cost patient has not started 10 mg dose yet he was still using up the 7.5 mg supply. last dose was 1.5 week ago. Will get 10 mg Zepbound prescription on Jan 10.  Will f/u in 4 weeks

## 2023-09-06 NOTE — Progress Notes (Addendum)
 Patient ID: Devin Marshall, male    DOB: 11-19-2000, 23 y.o.   MRN: [id number]  Chief Complaint  Patient presents with   Sinus Problem    Pt c/o runny nose, cough with mucus, fatigue, sneezing, sore throat and hoarseness. Present for 2 days, Has tried theraflu, halls cough drops and cough syrup.        Discussed the use of AI scribe software for clinical note transcription with the patient, who gave verbal consent to proceed.  History of Present Illness   The patient, with a history of asthma and morbid obesity, presents with a week-long history of cough and mucus production. The mucus is described as gelatinous with red streaks and yellow in color. The patient also reports a coarse voice, which started before the onset of the cough. The patient has been using a nebulizer with albuterol  and budesonide  2 times a day since the onset of symptoms. The patient also reports nasal drainage and a sore throat. The patient denies any eye irritation or drainage.     Assessment & Plan:     Acute Respiratory Symptoms in the setting of Asthma - Negative rapid testing. Pt noted to have hoarseness and wheezing on exam. Bilateral eyes injected with drainage, but pt denies any pain or am matted drainage, no itching or irritation. -Continue nebulizer treatments with albuterol  and budesonide  bid. -Increasing budesonide  to 1mg  bid. -Sending low dose steroid burst, advised on use & SE. -Increase water intake to 2L qd, try humidifier overnight for sx relief. -Try generic Mucinex bid to help with cough/congestion. -RTO precautions provided.    Subjective:    Outpatient Medications Prior to Visit  Medication Sig Dispense Refill   albuterol  (PROVENTIL ) (2.5 MG/3ML) 0.083% nebulizer solution Take 3 mLs (2.5 mg total) by nebulization every 4 (four) hours as needed for wheezing or shortness of breath. 75 mL 12   budesonide  (PULMICORT ) 0.5 MG/2ML nebulizer solution TAKE 2 MLS (0.5 MG TOTAL) BY NEBULIZATION 2 (TWO)  TIMES DAILY. (Patient taking differently: Take 0.5 mg by nebulization 2 (two) times daily as needed (shortness of breath).) 360 mL 4   buPROPion  (WELLBUTRIN  XL) 150 MG 24 hr tablet Take 150 mg by mouth daily.     fluticasone  (FLOVENT  HFA) 110 MCG/ACT inhaler Inhale 2 puffs into the lungs 2 (two) times daily. 1 Inhaler 12   furosemide  (LASIX ) 20 MG tablet Take 1 tablet (20 mg total) by mouth daily as needed (leg swelling). 30 tablet 11   levalbuterol  (XOPENEX  HFA) 45 MCG/ACT inhaler INHALE 1 TO 2 PUFFS BY MOUTH EVERY 4 HOURS AS NEEDED FOR WHEEZE 15 each 0   metoprolol  succinate (TOPROL -XL) 50 MG 24 hr tablet Take 1.5 tablets (75 mg total) by mouth daily. Take 1 pill in am and 1/2 pill qhs. 90 tablet 3   pantoprazole  (PROTONIX ) 40 MG tablet Please see PCP for refills 90 tablet 0   tirzepatide  (ZEPBOUND ) 10 MG/0.5ML Pen Inject 10 mg into the skin once a week. 2 mL 0   valsartan  (DIOVAN ) 40 MG tablet Take 1 tablet (40 mg total) by mouth daily. Please monitor BP 90 tablet 3   No facility-administered medications prior to visit.   Past Medical History:  Diagnosis Date   ADHD (attention deficit hyperactivity disorder)    Allergy    Anxiety    Asthma    Asthma    Phreesia 01/28/2020   BMI (body mass index), pediatric, > 99% for age 27/05/2015   Conjunctivitis 05/08/2013   Depression  Eating disorder    Eczema    Encounter for routine child health examination without abnormal findings 01/30/2020   Morbid obesity (HCC)    Obesity    Situational anxiety    Sleep apnea    Undiagnosed cardiac murmurs 12/10/2012   Past Surgical History:  Procedure Laterality Date   CIRCUMCISION     Allergies  Allergen Reactions   Other Itching    Had a reaction to flu shot in 2009, 2010 and 2011 had flu mist with no reaction then 2012 had flu shot again and developed itching and redness      Objective:    Physical Exam Vitals and nursing note reviewed.  Constitutional:      General: He is not in  acute distress.    Appearance: Normal appearance. He is morbidly obese.  HENT:     Head: Normocephalic.     Right Ear: Tympanic membrane and ear canal normal.     Left Ear: Tympanic membrane and ear canal normal.     Nose:     Right Sinus: Frontal sinus tenderness present. No maxillary sinus tenderness.     Left Sinus: Frontal sinus tenderness present. No maxillary sinus tenderness.     Mouth/Throat:     Mouth: Mucous membranes are moist.     Pharynx: No pharyngeal swelling, oropharyngeal exudate, posterior oropharyngeal erythema or uvula swelling.     Tonsils: No tonsillar exudate or tonsillar abscesses.  Cardiovascular:     Rate and Rhythm: Normal rate and regular rhythm.  Pulmonary:     Effort: Pulmonary effort is normal.     Breath sounds: Examination of the right-upper field reveals wheezing. Examination of the left-upper field reveals wheezing. Examination of the right-middle field reveals wheezing. Examination of the left-middle field reveals wheezing. Wheezing present.  Musculoskeletal:        General: Normal range of motion.     Cervical back: Normal range of motion.  Lymphadenopathy:     Head:     Right side of head: No preauricular or posterior auricular adenopathy.     Left side of head: No preauricular or posterior auricular adenopathy.     Cervical: No cervical adenopathy.  Skin:    General: Skin is warm and dry.  Neurological:     Mental Status: He is alert and oriented to person, place, and time.  Psychiatric:        Mood and Affect: Mood normal.    BP 118/79 (BP Location: Left Arm, Patient Position: Sitting, Cuff Size: Large)   Pulse (!) 104   Temp 98 F (36.7 C) (Temporal)   Ht 5' 7 (1.702 m)   Wt (!) 448 lb 3.2 oz (203.3 kg)   SpO2 96%   BMI 70.20 kg/m  Wt Readings from Last 3 Encounters:  09/06/23 (!) 448 lb 3.2 oz (203.3 kg)  03/31/23 (!) 462 lb (209.6 kg)  02/15/23 (!) 456 lb (206.8 kg)     Lucius Krabbe, NP

## 2023-09-12 ENCOUNTER — Ambulatory Visit
Admission: EM | Admit: 2023-09-12 | Discharge: 2023-09-12 | Disposition: A | Discharge status: Home / Self Care | Payer: Managed Care, Other (non HMO)

## 2023-09-12 DIAGNOSIS — J22 Unspecified acute lower respiratory infection: Secondary | ICD-10-CM | POA: Diagnosis not present

## 2023-09-12 DIAGNOSIS — J4541 Moderate persistent asthma with (acute) exacerbation: Secondary | ICD-10-CM | POA: Insufficient documentation

## 2023-09-12 DIAGNOSIS — J4531 Mild persistent asthma with (acute) exacerbation: Secondary | ICD-10-CM

## 2023-09-12 MED ORDER — LEVALBUTEROL TARTRATE 45 MCG/ACT IN AERO: 1.0000 | INHALATION_SPRAY | Freq: Three times a day (TID) | RESPIRATORY_TRACT | 0 refills | Status: AC | PRN

## 2023-09-12 MED ORDER — PREDNISONE 20 MG PO TABS: ORAL_TABLET | ORAL | 0 refills | Status: DC

## 2023-09-12 MED ORDER — IPRATROPIUM-ALBUTEROL 0.5-2.5 (3) MG/3ML IN SOLN
3.0000 mL | Freq: Once | RESPIRATORY_TRACT | Status: AC
  Administered 2023-09-12: 3 mL via RESPIRATORY_TRACT

## 2023-09-12 MED ORDER — AMOXICILLIN-POT CLAVULANATE 875-125 MG PO TABS: 1.0000 | ORAL_TABLET | Freq: Two times a day (BID) | ORAL | 0 refills | Status: DC

## 2023-09-12 NOTE — Discharge Instructions (Signed)
 We have refilled your medication, take Augmentin  and prednisone  as directed.  Avoid smoke exposure.  Follow-up with PCP in 2 to 3 days if symptoms persist.  Go to emergency room for new or worsening issues or concerns(chest pain, palpitations, worsening shortness of breath, etc)

## 2023-09-12 NOTE — ED Provider Notes (Signed)
 [facility]    CSN: [id number] Arrival date & time: 09/12/23  1829      History   Chief Complaint Chief Complaint  Patient presents with   Asthma Exacerbation   Sore Throat   Hoarse    HPI Devin Marshall is a 23 y.o. male.   23 year old male patient, Devin Marshall, presents to urgent care for cough short of breath wheezing sore throat, patient has taken neb treatments, needs refill on medications  Patient has a past medical history of asthma, secondhand smoke exposure  The history is provided by the patient. No language interpreter was used.    Past Medical History:  Diagnosis Date   ADHD (attention deficit hyperactivity disorder)    Allergy    Anxiety    Asthma    Asthma    Phreesia 01/28/2020   BMI (body mass index), pediatric, > 99% for age 63/05/2015   Conjunctivitis 05/08/2013   Depression    Eating disorder    Eczema    Encounter for routine child health examination without abnormal findings 01/30/2020   Morbid obesity (HCC)    Obesity    Situational anxiety    Sleep apnea    Undiagnosed cardiac murmurs 12/10/2012    Patient Active Problem List   Diagnosis Date Noted   Acute respiratory infection 09/12/2023   Anxiety and depression 12/23/2022   Dilated cardiomyopathy (HCC) 01/30/2020   LVH (left ventricular hypertrophy) 11/10/2017   Obstructive sleep apnea syndrome 10/12/2017   Morbid obesity (HCC) 12/31/2012   Moderate persistent asthma with acute exacerbation 08/30/2012   Mild persistent asthma with allergic rhinitis without complication 04/25/2011    Class: Chronic    Past Surgical History:  Procedure Laterality Date   CIRCUMCISION         Home Medications    Prior to Admission medications   Medication Sig Start Date End Date Taking? Authorizing Provider  amoxicillin -clavulanate (AUGMENTIN ) 875-125 MG tablet Take 1 tablet by mouth every 12 (twelve) hours. 09/12/23  Yes Kemi Gell, Rilla, NP  buPROPion  (WELLBUTRIN  XL)  150 MG 24 hr tablet Take 150 mg by mouth daily.   Yes [provider]  furosemide  (LASIX ) 20 MG tablet Take 1 tablet (20 mg total) by mouth daily as needed (leg swelling). 02/15/23  Yes Chandrasekhar, Mahesh A, MD  metoprolol  succinate (TOPROL -XL) 50 MG 24 hr tablet Take 1.5 tablets (75 mg total) by mouth daily. Take 1 pill in am and 1/2 pill qhs. 06/22/23  Yes Ladona Heinz, MD  pantoprazole  (PROTONIX ) 40 MG tablet Please see PCP for refills 02/15/23  Yes Chandrasekhar, Mahesh A, MD  valsartan  (DIOVAN ) 40 MG tablet Take 1 tablet (40 mg total) by mouth daily. Please monitor BP 03/17/23  Yes Chandrasekhar, Mahesh A, MD  budesonide  (PULMICORT ) 1 MG/2ML nebulizer solution Take 2 mLs (1 mg total) by nebulization daily. 09/06/23   Lucius Krabbe, NP  levalbuterol  (XOPENEX  HFA) 45 MCG/ACT inhaler Inhale 1-2 puffs into the lungs every 8 (eight) hours as needed for wheezing or shortness of breath. 09/12/23   Joana Nolton, NP  predniSONE  (DELTASONE ) 20 MG tablet Take 2 pills in the morning with breakfast for 3 days, then 1 pill for 4 days 09/12/23   Emillia Weatherly, Rilla, NP  tirzepatide  (ZEPBOUND ) 10 MG/0.5ML Pen Inject 10 mg into the skin once a week. 08/07/23   Santo Stanly LABOR, MD    Family History Family History  Problem Relation Age of Onset   Hypertension Mother    Asthma Mother  Sleep apnea Mother    Diabetes Mother    Hyperlipidemia Mother    Obesity Mother    Hypertension Maternal Grandmother    Diabetes Paternal Grandmother    Asthma Brother        wheezing, young age   Sleep apnea Brother    Diabetes type I Brother    Drug abuse Father    Heart disease Neg Hx    Stroke Neg Hx    Depression Neg Hx    Kidney disease Neg Hx    Alcohol abuse Neg Hx    Arthritis Neg Hx    Birth defects Neg Hx    Cancer Neg Hx    COPD Neg Hx    Early death Neg Hx    Hearing loss Neg Hx    Learning disabilities Neg Hx    Mental illness Neg Hx    Mental retardation Neg Hx     Miscarriages / Stillbirths Neg Hx    Vision loss Neg Hx    Varicose Veins Neg Hx     Social History Social History   Tobacco Use   Smoking status: Never    Passive exposure: Yes   Smokeless tobacco: Never   Tobacco comments:    both parents smoke indoors and outdoors.  Vaping Use   Vaping status: Never Used  Substance Use Topics   Alcohol use: Never   Drug use: Never     Allergies   Other   Review of Systems Review of Systems  Constitutional:  Negative for fever.  HENT:  Positive for congestion.   Respiratory:  Positive for cough, shortness of breath and wheezing.   All other systems reviewed and are negative.    Physical Exam Triage Vital Signs ED Triage Vitals  Encounter Vitals Group     BP 09/12/23 1839 121/88     Systolic BP Percentile --      Diastolic BP Percentile --      Pulse Rate 09/12/23 1839 (!) 108     Resp 09/12/23 1839 (!) 32     Temp 09/12/23 1839 98.3 F (36.8 C)     Temp Source 09/12/23 1839 Oral     SpO2 09/12/23 1839 95 %     Weight 09/12/23 1842 (!) 460 lb (208.7 kg)     Height 09/12/23 1842 5' 7 (1.702 m)     Head Circumference --      Peak Flow --      Pain Score 09/12/23 1841 0     Pain Loc --      Pain Education --      Exclude from Growth Chart --    No data found.  Updated Vital Signs BP 121/88 (BP Location: Left Arm)   Pulse (!) 104   Temp 98.3 F (36.8 C) (Oral)   Resp (!) 24   Ht 5' 7 (1.702 m)   Wt (!) 460 lb (208.7 kg)   SpO2 94%   BMI 72.05 kg/m   Visual Acuity Right Eye Distance:   Left Eye Distance:   Bilateral Distance:    Right Eye Near:   Left Eye Near:    Bilateral Near:     Physical Exam Vitals and nursing note reviewed.  Constitutional:      Appearance: He is morbidly obese.  HENT:     Head: Normocephalic.     Right Ear: Tympanic membrane normal.     Left Ear: Tympanic membrane normal.     Nose: Congestion present.  Mouth/Throat:     Pharynx: Uvula midline. Posterior oropharyngeal  erythema present.     Tonsils: No tonsillar exudate or tonsillar abscesses.  Cardiovascular:     Rate and Rhythm: Regular rhythm. Tachycardia present.     Pulses: Normal pulses.     Heart sounds: Normal heart sounds.  Pulmonary:     Breath sounds: Examination of the right-upper field reveals wheezing. Examination of the left-upper field reveals wheezing. Examination of the right-lower field reveals wheezing. Examination of the left-lower field reveals wheezing. Wheezing present.  Neurological:     General: No focal deficit present.     Mental Status: He is alert and oriented to person, place, and time.     GCS: GCS eye subscore is 4. GCS verbal subscore is 5. GCS motor subscore is 6.     Cranial Nerves: No cranial nerve deficit.     Sensory: No sensory deficit.  Psychiatric:        Attention and Perception: Attention normal.        Mood and Affect: Mood normal.        Behavior: Behavior is cooperative.      UC Treatments / Results  Labs (all labs ordered are listed, but only abnormal results are displayed) Labs Reviewed - No data to display  EKG   Radiology No results found.  Procedures Procedures (including critical care time)  Medications Ordered in UC Medications  ipratropium-albuterol  (DUONEB) 0.5-2.5 (3) MG/3ML nebulizer solution 3 mL (3 mLs Nebulization Given 09/12/23 1857)    Initial Impression / Assessment and Plan / UC Course  I have reviewed the triage vital signs and the nursing notes.  Pertinent labs & imaging results that were available during my care of the patient were reviewed by me and considered in my medical decision making (see chart for details).    Received DuoNeb in office, improved respiratory sounds after treatment, wheezing decreased, discussed strict go to ER precautions, patient and mom both verbalized understanding this provider.  Ddx: Asthma exacerbation, URI Final Clinical Impressions(s) / UC Diagnoses   Final diagnoses:  Moderate  persistent asthma with acute exacerbation  Acute respiratory infection     Discharge Instructions      We have refilled your medication, take Augmentin  and prednisone  as directed.  Avoid smoke exposure.  Follow-up with PCP in 2 to 3 days if symptoms persist.  Go to emergency room for new or worsening issues or concerns(chest pain, palpitations, worsening shortness of breath, etc)     ED Prescriptions     Medication Sig Dispense Auth. Provider   predniSONE  (DELTASONE ) 20 MG tablet Take 2 pills in the morning with breakfast for 3 days, then 1 pill for 4 days 10 tablet Kierston Plasencia, NP   levalbuterol  (XOPENEX  HFA) 45 MCG/ACT inhaler Inhale 1-2 puffs into the lungs every 8 (eight) hours as needed for wheezing or shortness of breath. 15 g Sephora Boyar, NP   amoxicillin -clavulanate (AUGMENTIN ) 875-125 MG tablet Take 1 tablet by mouth every 12 (twelve) hours. 14 tablet Daielle Melcher, Rilla, NP      PDMP not reviewed this encounter.   Aminta Rilla, NP 09/12/23 2105

## 2023-09-12 NOTE — ED Triage Notes (Signed)
 Here with Mom. "This started with URI with no cough (about 2 wks ago), then the cough began with a productive cough now causing SOB/Wheezing, then sore throat and today hoarse voice". No fever known. No new/unexplained rash.

## 2023-09-27 ENCOUNTER — Other Ambulatory Visit (HOSPITAL_COMMUNITY): Payer: Self-pay

## 2023-10-06 ENCOUNTER — Telehealth: Payer: Self-pay | Admitting: Pharmacist

## 2023-10-06 NOTE — Telephone Encounter (Signed)
 LVM to titrate Zepbound  dose

## 2023-10-10 ENCOUNTER — Other Ambulatory Visit (HOSPITAL_COMMUNITY): Payer: Self-pay

## 2023-10-20 ENCOUNTER — Telehealth: Payer: Self-pay | Admitting: Internal Medicine

## 2023-10-20 NOTE — Telephone Encounter (Signed)
   Pt c/o of Chest Pain: STAT if active CP, including tightness, pressure, jaw pain, radiating pain to shoulder/upper arm/back, CP unrelieved by Nitro. Symptoms reported of SOB, nausea, vomiting, sweating.  1. Are you having CP right now?   Yes  2. Are you experiencing any other symptoms (ex. SOB, nausea, vomiting, sweating)?   Sweating  3. Is your CP continuous or coming and going?   Constant  4. Have you taken Nitroglycerin?  No  5. How long have you been experiencing CP?   Started about an hour ago    6. If NO CP at time of call then end call with telling Pt to call back or call 911 if Chest pain returns prior to return call from triage team.   Patient's mother [name]) stated patient complained of having chest pain after he ate about an hour ago.

## 2023-10-20 NOTE — Telephone Encounter (Signed)
 Pt's mother contacted office and advise that pt is having active chest pain and sweating. She states that the pt was eating and chest pain occurred right after. Pt has not taken nitroglycerin, but tried pantoprazole with no relief. Pt denied nausea/vomiting and shortness of breath. Pt mother advised to call 911 to go to emergency room. Agreed with plan of care.

## 2023-10-26 ENCOUNTER — Other Ambulatory Visit: Payer: Self-pay | Admitting: Family

## 2023-10-26 DIAGNOSIS — J4531 Mild persistent asthma with (acute) exacerbation: Secondary | ICD-10-CM

## 2023-10-30 NOTE — Telephone Encounter (Signed)
 Call to follow up on Zepbound. N/A LVM.

## 2023-11-29 ENCOUNTER — Other Ambulatory Visit: Payer: Self-pay | Admitting: Cardiology

## 2024-02-16 ENCOUNTER — Other Ambulatory Visit: Payer: Self-pay

## 2024-02-16 DIAGNOSIS — I422 Other hypertrophic cardiomyopathy: Secondary | ICD-10-CM

## 2024-02-16 NOTE — Addendum Note (Signed)
 Addended by: Christine Cozier on: 02/16/2024 10:23 AM   Modules accepted: Orders

## 2024-02-16 NOTE — Progress Notes (Signed)
 Placed order for Echo previous order  expires on 03/16/24 pt scheduled for 03/26/24.

## 2024-02-19 ENCOUNTER — Ambulatory Visit
Admission: EM | Admit: 2024-02-19 | Discharge: 2024-02-19 | Disposition: A | Discharge status: Home / Self Care | Attending: Family Medicine | Admitting: Family Medicine

## 2024-02-19 ENCOUNTER — Encounter: Payer: Self-pay | Admitting: Emergency Medicine

## 2024-02-19 DIAGNOSIS — J4541 Moderate persistent asthma with (acute) exacerbation: Secondary | ICD-10-CM | POA: Diagnosis not present

## 2024-02-19 DIAGNOSIS — Z7952 Long term (current) use of systemic steroids: Secondary | ICD-10-CM

## 2024-02-19 LAB — POC SARS CORONAVIRUS 2 AG -  ED: SARS Coronavirus 2 Ag: NEGATIVE

## 2024-02-19 MED ORDER — PREDNISONE 20 MG PO TABS: 40.0000 mg | ORAL_TABLET | Freq: Every day | ORAL | 0 refills | Status: AC

## 2024-02-19 MED ORDER — IPRATROPIUM-ALBUTEROL 0.5-2.5 (3) MG/3ML IN SOLN
3.0000 mL | Freq: Once | RESPIRATORY_TRACT | Status: AC
  Administered 2024-02-19: 3 mL via RESPIRATORY_TRACT

## 2024-02-19 MED ORDER — BENZONATATE 100 MG PO CAPS: 100.0000 mg | ORAL_CAPSULE | Freq: Three times a day (TID) | ORAL | 0 refills | Status: DC | PRN

## 2024-02-19 NOTE — ED Provider Notes (Signed)
 [facility]    CSN: [id number] Arrival date & time: 02/19/24  1829      History   Chief Complaint Chief Complaint  Patient presents with   Cough    HPI Devin Marshall is a 23 y.o. male.    Cough   Here for cough and chest congestion and wheezing.  No fever so far.  Not much nasal drainage.  No vomiting or diarrhea.  He has been using his inhalers and nebulizers, but he last used anything about 6 hours ago.  NKDA    Past Medical History:  Diagnosis Date   ADHD (attention deficit hyperactivity disorder)    Allergy    Anxiety    Asthma    Asthma    [employer]reesia 01/28/2020   BMI (body mass index), pediatric, > 99% for age 52/05/2015   Conjunctivitis 05/08/2013   Depression    Eating disorder    Eczema    Encounter for routine child health examination without abnormal findings 01/30/2020   Morbid obesity (HCC)    Obesity    Situational anxiety    Sleep apnea    Undiagnosed cardiac murmurs 12/10/2012    Patient Active Problem List   Diagnosis Date Noted   Acute respiratory infection 09/12/2023   Anxiety and depression 12/23/2022   Dilated cardiomyopathy (HCC) 01/30/2020   LVH (left ventricular hypertrophy) 11/10/2017   Obstructive sleep apnea syndrome 10/12/2017   Morbid obesity (HCC) 12/31/2012   Moderate persistent asthma with acute exacerbation 08/30/2012   Mild persistent asthma with allergic rhinitis without complication 04/25/2011    Class: Chronic    Past Surgical History:  Procedure Laterality Date   CIRCUMCISION         Home Medications    Prior to Admission medications   Medication Sig Start Date End Date Taking? Authorizing Provider  benzonatate (TESSALON) 100 MG capsule Take 1 capsule (100 mg total) by mouth 3 (three) times daily as needed for cough. 02/19/24  Yes Vonna Sharlet POUR, MD  predniSONE  (DELTASONE ) 20 MG tablet Take 2 tablets (40 mg total) by mouth daily with breakfast for 5 days. 02/19/24 02/24/24 Yes Laysa Kimmey,  Sharlet POUR, MD  budesonide  (PULMICORT ) 1 MG/2ML nebulizer solution TAKE 2 ML (1 MG TOTAL) BY NEBULIZATION DAILY 10/27/23   Lucius Krabbe, NP  furosemide  (LASIX ) 20 MG tablet Take 1 tablet (20 mg total) by mouth daily as needed (leg swelling). 02/15/23   Santo Stanly LABOR, MD  levalbuterol  (XOPENEX  HFA) 45 MCG/ACT inhaler Inhale 1-2 puffs into the lungs every 8 (eight) hours as needed for wheezing or shortness of breath. 09/12/23   Defelice, Jeanette, NP  metoprolol  succinate (TOPROL -XL) 50 MG 24 hr tablet TAKE 1.5 TABLETS (75 MG TOTAL) TAKE 1 PILL IN AM AND 1/2 PILL AT BEDTIME 11/29/23   Santo Stanly A, MD  pantoprazole  (PROTONIX ) 40 MG tablet Please see PCP for refills 02/15/23   Santo Stanly A, MD  tirzepatide  (ZEPBOUND ) 10 MG/0.5ML Pen Inject 10 mg into the skin once a week. 08/07/23   Chandrasekhar, Stanly LABOR, MD  valsartan  (DIOVAN ) 40 MG tablet Take 1 tablet (40 mg total) by mouth daily. Please monitor BP 03/17/23   Santo Stanly LABOR, MD    Family History Family History  Problem Relation Age of Onset   Hypertension Mother    Asthma Mother    Sleep apnea Mother    Diabetes Mother    Hyperlipidemia Mother    Obesity Mother    Hypertension Maternal Grandmother    Diabetes  Paternal Grandmother    Asthma Brother        wheezing, young age   Sleep apnea Brother    Diabetes type I Brother    Drug abuse Father    Heart disease Neg Hx    Stroke Neg Hx    Depression Neg Hx    Kidney disease Neg Hx    Alcohol abuse Neg Hx    Arthritis Neg Hx    Birth defects Neg Hx    Cancer Neg Hx    COPD Neg Hx    Early death Neg Hx    Hearing loss Neg Hx    Learning disabilities Neg Hx    Mental illness Neg Hx    Mental retardation Neg Hx    Miscarriages / Stillbirths Neg Hx    Vision loss Neg Hx    Varicose Veins Neg Hx     Social History Social History   Tobacco Use   Smoking status: Never    Passive exposure: Yes   Smokeless tobacco: Never   Tobacco  comments:    both parents smoke indoors and outdoors.  Vaping Use   Vaping status: Never Used  Substance Use Topics   Alcohol use: Never   Drug use: Never     Allergies   Other   Review of Systems Review of Systems  Respiratory:  Positive for cough.      Physical Exam Triage Vital Signs ED Triage Vitals  Encounter Vitals Group     BP 02/19/24 1930 129/74     Girls Systolic BP Percentile --      Girls Diastolic BP Percentile --      Boys Systolic BP Percentile --      Boys Diastolic BP Percentile --      Pulse Rate 02/19/24 1930 97     Resp 02/19/24 1923 (!) 24     Temp 02/19/24 1930 99.3 F (37.4 C)     Temp Source 02/19/24 1930 Oral     SpO2 02/19/24 1930 97 %     Weight --      Height --      Head Circumference --      Peak Flow --      Pain Score 02/19/24 1930 7     Pain Loc --      Pain Education --      Exclude from Growth Chart --    No data found.  Updated Vital Signs BP 129/74 (BP Location: Left Arm)   Pulse 97   Temp 99.3 F (37.4 C) (Oral)   Resp (!) 24   SpO2 97%   Visual Acuity Right Eye Distance:   Left Eye Distance:   Bilateral Distance:    Right Eye Near:   Left Eye Near:    Bilateral Near:     Physical Exam Vitals reviewed.  Constitutional:      General: He is not in acute distress.    Appearance: He is not toxic-appearing.  HENT:     Right Ear: Tympanic membrane and ear canal normal.     Left Ear: Tympanic membrane and ear canal normal.     Nose: Nose normal.     Mouth/Throat:     Mouth: Mucous membranes are moist.     Pharynx: No oropharyngeal exudate or posterior oropharyngeal erythema.   Eyes:     Extraocular Movements: Extraocular movements intact.     Conjunctiva/sclera: Conjunctivae normal.     Pupils: Pupils are equal, round, and  reactive to light.    Cardiovascular:     Rate and Rhythm: Normal rate and regular rhythm.     Heart sounds: No murmur heard. Pulmonary:     Breath sounds: No stridor.      Comments: There is expiratory wheezing with fairly good air movement at the time of exam.  Musculoskeletal:     Cervical back: Neck supple.  Lymphadenopathy:     Cervical: No cervical adenopathy.   Skin:    Capillary Refill: Capillary refill takes less than 2 seconds.     Coloration: Skin is not jaundiced or pale.   Neurological:     General: No focal deficit present.     Mental Status: He is alert and oriented to person, place, and time.   Psychiatric:        Behavior: Behavior normal.      UC Treatments / Results  Labs (all labs ordered are listed, but only abnormal results are displayed) Labs Reviewed  POC SARS CORONAVIRUS 2 AG -  ED    EKG   Radiology No results found.  Procedures Procedures (including critical care time)  Medications Ordered in UC Medications  ipratropium-albuterol  (DUONEB) 0.5-2.5 (3) MG/3ML nebulizer solution 3 mL (3 mLs Nebulization Given 02/19/24 1942)    Initial Impression / Assessment and Plan / UC Course  I have reviewed the triage vital signs and the nursing notes.  Pertinent labs & imaging results that were available during my care of the patient were reviewed by me and considered in my medical decision making (see chart for details).     COVID swab is negative. DuoNeb was given here and did improve his symptoms and exam.  Prednisone  is sent in for the asthma exacerbation and Tessalon Perles are sent in for the cough. Final Clinical Impressions(s) / UC Diagnoses   Final diagnoses:  Moderate persistent asthma dependent on systemic steroids with acute exacerbation     Discharge Instructions      The COVID test was negative.  We gave you 1 dose of albuterol /ipratropium in the nebulizer  Take prednisone  20 mg--2 daily for 5 days  Take benzonatate 100 mg, 1 tab every 8 hours as needed for cough.  Use your nebulizer or your inhaler every 4 hours as needed.     ED Prescriptions     Medication Sig Dispense Auth.  Provider   benzonatate (TESSALON) 100 MG capsule Take 1 capsule (100 mg total) by mouth 3 (three) times daily as needed for cough. 21 capsule Vonna Sharlet POUR, MD   predniSONE  (DELTASONE ) 20 MG tablet Take 2 tablets (40 mg total) by mouth daily with breakfast for 5 days. 10 tablet Vonna Pola Furno K, MD      PDMP not reviewed this encounter.   Vonna Sharlet POUR, MD 02/19/24 2033

## 2024-02-19 NOTE — Discharge Instructions (Signed)
 The COVID test was negative.  We gave you 1 dose of albuterol /ipratropium in the nebulizer  Take prednisone  20 mg--2 daily for 5 days  Take benzonatate 100 mg, 1 tab every 8 hours as needed for cough.  Use your nebulizer or your inhaler every 4 hours as needed.

## 2024-02-19 NOTE — ED Triage Notes (Addendum)
 Pt to ED with c/o productive cough x's 3 days (green)  Has been taking cough meds without relief

## 2024-03-26 ENCOUNTER — Ambulatory Visit (HOSPITAL_COMMUNITY): Admission: RE | Admit: 2024-03-26 | Source: Ambulatory Visit

## 2024-03-26 ENCOUNTER — Ambulatory Visit (HOSPITAL_COMMUNITY)
Admission: RE | Admit: 2024-03-26 | Discharge: 2024-03-26 | Disposition: A | Discharge status: Home / Self Care | Source: Ambulatory Visit | Attending: Internal Medicine | Admitting: Internal Medicine

## 2024-03-26 DIAGNOSIS — I422 Other hypertrophic cardiomyopathy: Secondary | ICD-10-CM | POA: Insufficient documentation

## 2024-03-26 LAB — ECHOCARDIOGRAM COMPLETE
Area-P 1/2: 4.01 cm2
S' Lateral: 2.7 cm

## 2024-03-26 MED ORDER — PERFLUTREN LIPID MICROSPHERE
1.0000 mL | INTRAVENOUS | Status: AC | PRN
  Administered 2024-03-26: 2 mL via INTRAVENOUS

## 2024-03-29 ENCOUNTER — Ambulatory Visit: Payer: Self-pay

## 2024-03-29 DIAGNOSIS — R0609 Other forms of dyspnea: Secondary | ICD-10-CM

## 2024-03-29 DIAGNOSIS — I422 Other hypertrophic cardiomyopathy: Secondary | ICD-10-CM

## 2024-03-31 ENCOUNTER — Other Ambulatory Visit: Payer: Self-pay | Admitting: Internal Medicine

## 2024-04-04 ENCOUNTER — Ambulatory Visit: Attending: Internal Medicine

## 2024-04-04 DIAGNOSIS — R0609 Other forms of dyspnea: Secondary | ICD-10-CM

## 2024-04-04 DIAGNOSIS — I422 Other hypertrophic cardiomyopathy: Secondary | ICD-10-CM

## 2024-04-04 NOTE — Telephone Encounter (Signed)
-----   Message from Hamp LOISE Norrie, RN sent at 04/04/2024  2:10 PM EDT -----  Could you f/u with pt regarding Zepbound ? ----- Message ----- From: Santo Stanly LABOR, MD Sent: 03/27/2024  12:24 PM EDT To: Hamp LOISE Norrie, RN  Obstructive gradient not seen in prior imaging at [facility] - hypertrophy approaching 30 mm (high risk feature) - unable to get CMR at this time (weight- on GLP-1 RA) - heart monitor ordered but not completed in 2024 - recommend completing heart monitor then follow up with me  Stanly Santo, MD FASE [facility] Cardiologist [facility]  [address] [place], [place] [address] [phone]  12:24 PM   ----- Message ----- From: Interface, [employer] Sent: 03/26/2024   7:00 PM EDT To: Stanly LABOR Santo, MD

## 2024-04-04 NOTE — Telephone Encounter (Signed)
 Pt reports does not recall receiving a heart monitor in the mail.  I have reordered monitor.   Pt would like to continue taking Zepbound  will send a message to [place], [facility] to f/u with pt.

## 2024-04-04 NOTE — Telephone Encounter (Signed)
 Call to see how long patient is off of  GLP1 therapy. And is there update in the insurance. Last PA exp 11/2023. N/A LVM

## 2024-04-04 NOTE — Progress Notes (Unsigned)
 Enrolled for [employer] to mail a ZIO XT long term holter monitor to the patients address on file.

## 2024-04-10 ENCOUNTER — Other Ambulatory Visit: Payer: Self-pay

## 2024-04-10 ENCOUNTER — Observation Stay (HOSPITAL_BASED_OUTPATIENT_CLINIC_OR_DEPARTMENT_OTHER)

## 2024-04-10 ENCOUNTER — Encounter (HOSPITAL_COMMUNITY): Payer: Self-pay

## 2024-04-10 ENCOUNTER — Emergency Department (HOSPITAL_COMMUNITY)

## 2024-04-10 ENCOUNTER — Observation Stay (HOSPITAL_COMMUNITY)
Admission: EM | Admit: 2024-04-10 | Discharge: 2024-04-11 | Disposition: A | Discharge status: Home / Self Care | Attending: Internal Medicine | Admitting: Internal Medicine

## 2024-04-10 DIAGNOSIS — I422 Other hypertrophic cardiomyopathy: Secondary | ICD-10-CM | POA: Diagnosis not present

## 2024-04-10 DIAGNOSIS — R072 Precordial pain: Secondary | ICD-10-CM

## 2024-04-10 DIAGNOSIS — R079 Chest pain, unspecified: Principal | ICD-10-CM | POA: Diagnosis present

## 2024-04-10 DIAGNOSIS — M79661 Pain in right lower leg: Secondary | ICD-10-CM

## 2024-04-10 DIAGNOSIS — R7989 Other specified abnormal findings of blood chemistry: Secondary | ICD-10-CM

## 2024-04-10 DIAGNOSIS — I2510 Atherosclerotic heart disease of native coronary artery without angina pectoris: Secondary | ICD-10-CM | POA: Diagnosis not present

## 2024-04-10 DIAGNOSIS — J45909 Unspecified asthma, uncomplicated: Secondary | ICD-10-CM | POA: Insufficient documentation

## 2024-04-10 DIAGNOSIS — R002 Palpitations: Secondary | ICD-10-CM | POA: Diagnosis not present

## 2024-04-10 LAB — BASIC METABOLIC PANEL WITH GFR
Anion gap: 8 (ref 5–15)
BUN: 10 mg/dL (ref 6–20)
CO2: 24 mmol/L (ref 22–32)
Calcium: 8.4 mg/dL — ABNORMAL LOW (ref 8.9–10.3)
Chloride: 105 mmol/L (ref 98–111)
Creatinine, Ser: 0.96 mg/dL (ref 0.61–1.24)
GFR, Estimated: >60 mL/min (ref >60)
Glucose, Bld: 111 mg/dL — ABNORMAL HIGH (ref 70–99)
Potassium: 3.9 mmol/L (ref 3.5–5.1)
Sodium: 137 mmol/L (ref 135–145)

## 2024-04-10 LAB — TROPONIN I (HIGH SENSITIVITY)
Troponin I (High Sensitivity): 577 ng/L (ref <18)
Troponin I (High Sensitivity): 612 ng/L (ref <18)
Troponin I (High Sensitivity): 563 ng/L (ref <18)

## 2024-04-10 LAB — CBC
HCT: 44.6 % (ref 39.0–52.0)
Hemoglobin: 14.9 g/dL (ref 13.0–17.0)
MCH: 28 pg (ref 26.0–34.0)
MCHC: 33.4 g/dL (ref 30.0–36.0)
MCV: 83.7 fL (ref 80.0–100.0)
Platelets: 276 K/uL (ref 150–400)
RBC: 5.33 MIL/uL (ref 4.22–5.81)
RDW: 12.6 % (ref 11.5–15.5)
WBC: 9.9 K/uL (ref 4.0–10.5)
nRBC: 0 % (ref 0.0–0.2)

## 2024-04-10 LAB — BRAIN NATRIURETIC PEPTIDE: B Natriuretic Peptide: 223.7 pg/mL — ABNORMAL HIGH (ref 0.0–100.0)

## 2024-04-10 LAB — CBG MONITORING, ED: Glucose-Capillary: 87 mg/dL (ref 70–99)

## 2024-04-10 LAB — MRSA NEXT GEN BY PCR, NASAL: MRSA by PCR Next Gen: NOT DETECTED

## 2024-04-10 LAB — HEPARIN LEVEL (UNFRACTIONATED): Heparin Unfractionated: 0.42 [IU]/mL (ref 0.30–0.70)

## 2024-04-10 MED ORDER — FUROSEMIDE 20 MG PO TABS
20.0000 mg | ORAL_TABLET | Freq: Every day | ORAL | Status: DC
  Administered 2024-04-10 – 2024-04-11 (×3): 20 mg via ORAL
  Filled 2024-04-10 (×2): qty 1

## 2024-04-10 MED ORDER — IRBESARTAN 75 MG PO TABS
37.5000 mg | ORAL_TABLET | Freq: Every day | ORAL | Status: DC
  Administered 2024-04-10 – 2024-04-11 (×3): 37.5 mg via ORAL
  Filled 2024-04-10 (×2): qty 0.5

## 2024-04-10 MED ORDER — ACETAMINOPHEN 325 MG PO TABS
650.0000 mg | ORAL_TABLET | Freq: Four times a day (QID) | ORAL | Status: DC | PRN
  Administered 2024-04-10 (×2): 650 mg via ORAL
  Filled 2024-04-10: qty 2

## 2024-04-10 MED ORDER — HEPARIN (PORCINE) 25000 UT/250ML-% IV SOLN
1800.0000 [IU]/h | INTRAVENOUS | Status: DC
  Administered 2024-04-10 (×2): 1800 [IU]/h via INTRAVENOUS
  Filled 2024-04-10: qty 250

## 2024-04-10 MED ORDER — NALOXONE HCL 0.4 MG/ML IJ SOLN: 0.4000 mg | INTRAMUSCULAR | Status: DC | PRN

## 2024-04-10 MED ORDER — ONDANSETRON HCL 4 MG/2ML IJ SOLN: 4.0000 mg | Freq: Four times a day (QID) | INTRAMUSCULAR | Status: DC | PRN

## 2024-04-10 MED ORDER — ACETAMINOPHEN 650 MG RE SUPP
650.0000 mg | Freq: Four times a day (QID) | RECTAL | Status: DC | PRN
Start: 2024-04-10 — End: 2024-04-11

## 2024-04-10 MED ORDER — METOPROLOL SUCCINATE ER 100 MG PO TB24
100.0000 mg | ORAL_TABLET | Freq: Every day | ORAL | Status: DC
  Administered 2024-04-10 – 2024-04-11 (×3): 100 mg via ORAL
  Filled 2024-04-10: qty 4
  Filled 2024-04-10: qty 1

## 2024-04-10 MED ORDER — HEPARIN BOLUS VIA INFUSION
7000.0000 [IU] | Freq: Once | INTRAVENOUS | Status: AC
  Administered 2024-04-10 (×2): 7000 [IU] via INTRAVENOUS
  Filled 2024-04-10: qty 7000

## 2024-04-10 MED ORDER — IOHEXOL 350 MG/ML SOLN
75.0000 mL | Freq: Once | INTRAVENOUS | Status: AC | PRN
  Administered 2024-04-10 (×2): 75 mL via INTRAVENOUS

## 2024-04-10 MED ORDER — FENTANYL CITRATE PF 50 MCG/ML IJ SOSY: 25.0000 ug | PREFILLED_SYRINGE | INTRAMUSCULAR | Status: DC | PRN

## 2024-04-10 MED ORDER — IPRATROPIUM-ALBUTEROL 0.5-2.5 (3) MG/3ML IN SOLN: 3.0000 mL | Freq: Four times a day (QID) | RESPIRATORY_TRACT | Status: DC | PRN

## 2024-04-10 NOTE — ED Provider Triage Note (Signed)
 Emergency Medicine Provider Triage Evaluation Note  Deron Poole , a 23 y.o. male  was evaluated in triage.  Pt complains of chest pain.  He states he was watching YouTube and had sudden onset of 10 out of 10 chest pain with diaphoresis and shortness of breath.  Pain is currently subsided to 4 out of 10 in severity.  He states he is feeling much better at this time.  The patient has a history of HOCM. Patient took 324mg  ASA prior to arrival  Review of Systems  Positive:  Negative:   Physical Exam  BP 133/74 (BP Location: Right Arm)   Pulse 94   Temp 98.7 F (37.1 C)   Resp (!) 24   SpO2 95%  Gen:   Awake, no distress   Resp:  Normal effort  MSK:   Moves extremities without difficulty  Other:    Medical Decision Making  Medically screening exam initiated at 12:43 AM.  Appropriate orders placed.  Gavan Nordby was informed that the remainder of the evaluation will be completed by another provider, this initial triage assessment does not replace that evaluation, and the importance of remaining in the ED until their evaluation is complete.     Logan Ubaldo NOVAK, [place] 04/10/24 [phone]

## 2024-04-10 NOTE — ED Notes (Signed)
 Pt said he was leaving and mom stated she will take him to his doctor in the morning.

## 2024-04-10 NOTE — Plan of Care (Signed)
  Problem: Clinical Measurements: Goal: Respiratory complications will improve Outcome: Progressing Goal: Cardiovascular complication will be avoided Outcome: Progressing   Problem: Coping: Goal: Level of anxiety will decrease Outcome: Progressing   Problem: Pain Managment: Goal: General experience of comfort will improve and/or be controlled Outcome: Progressing   Problem: Safety: Goal: Ability to remain free from injury will improve Outcome: Progressing

## 2024-04-10 NOTE — ED Triage Notes (Signed)
 Pt BIB [facility] after sudden onset of CP x 1 hour ago while watching YouTube videos. Initial pain was 10/10 Pt also reports SOB and dizziness that accompanied the CP. Pt took ASA 324mg   and pain is now 4/10. Pt does have history of Hypertrophic obstructive cardiomyopathy

## 2024-04-10 NOTE — ED Provider Notes (Signed)
 [facility] Provider Note   CSN: [id number] Arrival date & time: 04/10/24  0011     Patient presents with: Chest Pain   Devin Marshall is a 23 y.o. male.  Patient with past medical history significant for hypertrophic obstructive cardiomyopathy, obesity, Mild persistent asthma, OSA, left ventricular hypertrophy, dilated cardiomyopathy presents to the emergency department via EMS complaining of sudden onset chest pain.  Chest pain reportedly began at around 11:30 PM.  He describes as sudden onset, 10 out of 10 in severity.  Patient with associated diaphoresis and shortness of breath.  He denies abdominal pain, nausea, vomiting.  At the time of my initial assessment patient states that his shortness of breath improved and pain was now a 4-10 in severity.  During my assessment once the patient was roomed he is once again endorsing chest pain it 6 or 7 out of 10 in severity with associated shortness of breath.  The patient took 324 mg of aspirin prior to coming to the emergency department.  Patient also endorses some mild lightheadedness.   Chest Pain      Prior to Admission medications   Medication Sig Start Date End Date Taking? Authorizing Provider  benzonatate  (TESSALON ) 100 MG capsule Take 1 capsule (100 mg total) by mouth 3 (three) times daily as needed for cough. 02/19/24   Vonna Sharlet POUR, MD  budesonide  (PULMICORT ) 1 MG/2ML nebulizer solution TAKE 2 ML (1 MG TOTAL) BY NEBULIZATION DAILY 10/27/23   Lucius Krabbe, NP  furosemide  (LASIX ) 20 MG tablet Take 1 tablet (20 mg total) by mouth daily as needed (leg swelling). 02/15/23   Santo Stanly LABOR, MD  levalbuterol  (XOPENEX  HFA) 45 MCG/ACT inhaler Inhale 1-2 puffs into the lungs every 8 (eight) hours as needed for wheezing or shortness of breath. 09/12/23   Defelice, Jeanette, NP  metoprolol  succinate (TOPROL -XL) 50 MG 24 hr tablet TAKE 1.5 TABLETS (75 MG TOTAL) TAKE 1 PILL IN AM AND  1/2 PILL AT BEDTIME 11/29/23   Santo Stanly A, MD  pantoprazole  (PROTONIX ) 40 MG tablet Please see PCP for refills 02/15/23   Santo Stanly A, MD  tirzepatide  (ZEPBOUND ) 10 MG/0.5ML Pen Inject 10 mg into the skin once a week. 08/07/23   Santo Stanly LABOR, MD  valsartan  (DIOVAN ) 40 MG tablet Take 1 tablet (40 mg total) by mouth daily. Please monitor BP. NEED OV. 04/02/24   Santo Stanly LABOR, MD    Allergies: Other    Review of Systems  Cardiovascular:  Positive for chest pain.    Updated Vital Signs BP 123/62   Pulse 88   Temp 98.7 F (37.1 C)   Resp 18   SpO2 99%   Physical Exam Vitals and nursing note reviewed.  Constitutional:      General: He is not in acute distress.    Appearance: He is well-developed. He is obese.  HENT:     Head: Normocephalic and atraumatic.  Eyes:     Conjunctiva/sclera: Conjunctivae normal.  Cardiovascular:     Rate and Rhythm: Normal rate and regular rhythm.     Heart sounds: Murmur heard.  Pulmonary:     Effort: Pulmonary effort is normal. No respiratory distress.     Breath sounds: Normal breath sounds.  Chest:     Chest wall: No tenderness.  Abdominal:     Palpations: Abdomen is soft.     Tenderness: There is no abdominal tenderness.  Musculoskeletal:        General:  No swelling.     Cervical back: Neck supple.     Right lower leg: No edema.     Left lower leg: No edema.  Skin:    General: Skin is warm and dry.     Capillary Refill: Capillary refill takes less than 2 seconds.  Neurological:     Mental Status: He is alert.  Psychiatric:        Mood and Affect: Mood normal.     (all labs ordered are listed, but only abnormal results are displayed) Labs Reviewed  BASIC METABOLIC PANEL WITH GFR - Abnormal; Notable for the following components:      Result Value   Glucose, Bld 111 (*)    Calcium 8.4 (*)    All other components within normal limits  BRAIN NATRIURETIC PEPTIDE - Abnormal; Notable for the  following components:   B Natriuretic Peptide 223.7 (*)    All other components within normal limits  TROPONIN I (HIGH SENSITIVITY) - Abnormal; Notable for the following components:   Troponin I (High Sensitivity) 577 (*)    All other components within normal limits  TROPONIN I (HIGH SENSITIVITY) - Abnormal; Notable for the following components:   Troponin I (High Sensitivity) 612 (*)    All other components within normal limits  CBC  HEPARIN  LEVEL (UNFRACTIONATED)    EKG: None  Radiology: CT Angio Chest PE W and/or Wo Contrast Result Date: 04/10/2024 CLINICAL DATA:  23 year old male with acute chest pain. Shortness of breath and dizziness. History of hypertrophic cardiomyopathy. EXAM: CT ANGIOGRAPHY CHEST WITH CONTRAST TECHNIQUE: Multidetector CT imaging of the chest was performed using the standard protocol during bolus administration of intravenous contrast. Multiplanar CT image reconstructions and MIPs were obtained to evaluate the vascular anatomy. RADIATION DOSE REDUCTION: This exam was performed according to the departmental dose-optimization program which includes automated exposure control, adjustment of the mA and/or kV according to patient size and/or use of iterative reconstruction technique. CONTRAST:  75mL OMNIPAQUE  IOHEXOL  350 MG/ML SOLN COMPARISON:  Chest radiographs 0044 hours today. FINDINGS: Cardiovascular: Suboptimal contrast bolus timing in the pulmonary arterial tree. Two hundred Hounsfield units in the main pulmonary artery, with respiratory motion degrading detail of segmental and distal pulmonary arteries in both lungs. No central or saddle embolus. No convincing lobar thrombus. Other pulmonary artery branches are not well evaluated. Cardiac enlargement with left ventricular myocardial hypertrophy suggested on series 7, image 235. No pericardial effusion. Negative visible aorta. Mediastinum/Nodes: Negative. No mediastinal mass or lymphadenopathy. Lungs/Pleura: Major airways  are patent. Mild respiratory motion. Mild mosaic attenuation in both lungs. No consolidation or pleural effusion. Upper Abdomen: Negative visible mostly noncontrast liver, spleen, pancreas, adrenal glands, kidneys, and bowel in the upper abdomen. Musculoskeletal: Negative. Review of the MIP images confirms the above findings. IMPRESSION: 1. Limited CTA for PE due to contrast timing and respiratory motion. No central or saddle pulmonary embolus. 2. 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 No pulmonary edema or inflammation identified. Electronically Signed   By: VEAR Hurst M.D.   On: 04/10/2024 04:45   DG Chest 2 View Result Date: 04/10/2024 CLINICAL DATA:  Chest pain EXAM: CHEST - 2 VIEW COMPARISON:  08/08/2018 FINDINGS: Cardiac shadow is mildly prominent. Slight increase in vascular congestion is noted without significant edema. No focal infiltrate or effusion is noted. No bony abnormality is seen. IMPRESSION: Mild vascular congestion without edema. Electronically Signed   By: Oneil Devonshire M.D.   On: 04/10/2024 01:17     .  Critical Care  Performed by: Logan Ubaldo NOVAK, PA-C Authorized by: Logan Ubaldo NOVAK, PA-C   Critical care provider statement:    Critical care time (minutes):  30   Critical care time was exclusive of:  Separately billable procedures and treating other patients   Critical care was necessary to treat or prevent imminent or life-threatening deterioration of the following conditions:  Cardiac failure (Troponin >500)   Critical care was time spent personally by me on the following activities:  Development of treatment plan with patient or surrogate, discussions with consultants, evaluation of patient's response to treatment, examination of patient, ordering and review of laboratory studies, ordering and review of radiographic studies, ordering and performing treatments and interventions, pulse oximetry, re-evaluation of  patient's condition and review of old charts    Medications Ordered in the ED  heparin  ADULT infusion 100 units/mL (25000 units/250mL) (1,800 Units/hr Intravenous New Bag/Given 04/10/24 0430)  acetaminophen  (TYLENOL ) tablet 650 mg (has no administration in time range)    Or  acetaminophen  (TYLENOL ) suppository 650 mg (has no administration in time range)  naloxone  (NARCAN ) injection 0.4 mg (has no administration in time range)  fentaNYL  (SUBLIMAZE ) injection 25 mcg (has no administration in time range)  ondansetron  (ZOFRAN ) injection 4 mg (has no administration in time range)  heparin  bolus via infusion 7,000 Units (7,000 Units Intravenous Bolus from Bag 04/10/24 0430)  iohexol  (OMNIPAQUE ) 350 MG/ML injection 75 mL (75 mLs Intravenous Contrast Given 04/10/24 0426)                                    Medical Decision Making Amount and/or Complexity of Data Reviewed Labs: ordered. Radiology: ordered.  Risk Prescription drug management.   This patient presents to the ED for concern of chest pain, this involves an extensive number of treatment options, and is a complaint that carries with it a high risk of complications and morbidity.  The differential diagnosis includes ACS, pneumonia, PE, HOCM related sequela, MSK pain, others   Co morbidities / Chronic conditions that complicate the patient evaluation  HOCM   Additional history obtained:  Additional history obtained from EMR External records from outside source obtained and reviewed including cardiology note.  Hypertrophy approaching 30 mm   Lab Tests:  I Ordered, and personally interpreted labs.  The pertinent results include: Initial troponin 577   Imaging Studies ordered:  I ordered imaging studies including chest x-ray, CT angio chest PE study I independently visualized and interpreted imaging which showed mild vascular congestion without edema 1. Limited CTA for PE due to contrast timing and respiratory motion.  No  central or saddle pulmonary embolus.    2. Bilateral pulmonary mosaic attenuation compatible with chronic  small vessel or small airway disease. No pulmonary edema or  inflammation identified.   I agree with the radiologist interpretation   Cardiac Monitoring: / EKG:  The patient was maintained on a cardiac monitor.  I personally viewed and interpreted the cardiac monitored which showed an underlying rhythm of: Sinus rhythm   Problem List / ED Course / Critical interventions / Medication management   I ordered medication including heparin  Reevaluation of the patient after these medicines showed that the patient stayed the same I have reviewed the patients home medicines and have made adjustments as needed   Consultations Obtained:  I requested consultation with the cardiologist, Dr.Salah,  and discussed lab and imaging findings as well as pertinent  plan - they recommend: admission to medicine, heparin , CT PE study to rule out PE  I requested consultation with medicine, Dr.Howerter, who agrees to see the patient for admission   Test / Admission - Considered:  Patient with elevated troponin, unable to completely rule out ACS at this time.  Heparin  initiated.  No PE on scan.  Plan to admit to medicine for further medical management.  Cardiology to continue to follow.       Final diagnoses:  Chest pain, unspecified type  Elevated troponin    ED Discharge Orders     None          Logan Ubaldo KATHEE DEVONNA 04/10/24 0548    Haze Lonni PARAS, MD 04/11/24 [phone]

## 2024-04-10 NOTE — H&P (Addendum)
 History and Physical    Patient: Devin Marshall FMW:[id number] DOB: October 22, 2000 DOA: 04/10/2024 DOS: the patient was seen and examined on 04/10/2024 PCP: Lucius Krabbe, NP  Patient coming from: Home  Chief Complaint:  Chief Complaint  Patient presents with   Chest Pain   HPI: Devin Marshall is a 23 y.o. male with medical history significant of ADHD, HOCM, OSA, asthma, morbid obesity, prior eating disorder, and anxiety/depression p/w chest pain and c/f NSTEMI.  Pt was in [facility] until last night when he had cp. Pt reports acute onset cp at 2300 while watching Youtube videos. The acute cp began as 10/10 then spread across his chest diffusely and was 7/10, before becoming a dull 3/10 pain over his heart. Pt endorses sweaty/clamminess at the time of this event, but denied any n/v, jaw claudication or L shoulder pain. Pt yelled for help as he lives with his parents, and his mother activated EMS. Of note, pt received a heart monitor in the mail on 10/11 (unclear indication).  In the ED, pt tachypneic on RA. Labs notable for BNP 223.7, and troponin 577-->612-->563. EDP consulted cards who agreed with hep gtt for ACS and medicine admission.  Review of Systems: As mentioned in the history of present illness. All other systems reviewed and are negative. Past Medical History:  Diagnosis Date   ADHD (attention deficit hyperactivity disorder)    Allergy    Anxiety    Asthma    Asthma    Phreesia 01/28/2020   BMI (body mass index), pediatric, > 99% for age 37/05/2015   Conjunctivitis 05/08/2013   Depression    Eating disorder    Eczema    Encounter for routine child health examination without abnormal findings 01/30/2020   Morbid obesity (HCC)    Obesity    Situational anxiety    Sleep apnea    Undiagnosed cardiac murmurs 12/10/2012   Past Surgical History:  Procedure Laterality Date   CIRCUMCISION     Social History:  reports that he has never smoked. He has been exposed to tobacco  smoke. He has never used smokeless tobacco. He reports that he does not drink alcohol and does not use drugs.  Allergies  Allergen Reactions   Other Itching    Had a reaction to flu shot in 2009, 2010 and 2011 had flu mist with no reaction then 2012 had flu shot again and developed itching and redness Case reported to [facility].    Family History  Problem Relation Age of Onset   Hypertension Mother    Asthma Mother    Sleep apnea Mother    Diabetes Mother    Hyperlipidemia Mother    Obesity Mother    Hypertension Maternal Grandmother    Diabetes Paternal Grandmother    Asthma Brother        wheezing, young age   Sleep apnea Brother    Diabetes type I Brother    Drug abuse Father    Heart disease Neg Hx    Stroke Neg Hx    Depression Neg Hx    Kidney disease Neg Hx    Alcohol abuse Neg Hx    Arthritis Neg Hx    Birth defects Neg Hx    Cancer Neg Hx    COPD Neg Hx    Early death Neg Hx    Hearing loss Neg Hx    Learning disabilities Neg Hx    Mental illness Neg Hx    Mental retardation Neg Hx  Miscarriages / Stillbirths Neg Hx    Vision loss Neg Hx    Varicose Veins Neg Hx     Prior to Admission medications   Medication Sig Start Date End Date Taking? Authorizing Provider  benzonatate  (TESSALON ) 100 MG capsule Take 1 capsule (100 mg total) by mouth 3 (three) times daily as needed for cough. 02/19/24  Yes Banister, Sharlet POUR, MD  budesonide  (PULMICORT ) 1 MG/2ML nebulizer solution TAKE 2 ML (1 MG TOTAL) BY NEBULIZATION DAILY 10/27/23  Yes Hudnell, Corean, NP  furosemide  (LASIX ) 20 MG tablet Take 1 tablet (20 mg total) by mouth daily as needed (leg swelling). Patient taking differently: Take 20 mg by mouth daily. 02/15/23  Yes Chandrasekhar, Mahesh A, MD  ibuprofen  (ADVIL ) 200 MG tablet Take 400 mg by mouth every 6 (six) hours as needed for mild pain (pain score 1-3) or headache.   Yes [provider]  levalbuterol  (XOPENEX  HFA) 45 MCG/ACT inhaler Inhale 1-2 puffs  into the lungs every 8 (eight) hours as needed for wheezing or shortness of breath. 09/12/23  Yes Defelice, Jeanette, NP  metoprolol  succinate (TOPROL -XL) 50 MG 24 hr tablet TAKE 1.5 TABLETS (75 MG TOTAL) TAKE 1 PILL IN AM AND 1/2 PILL AT BEDTIME Patient taking differently: Take 50 mg by mouth. TAKE 1.5 TABLETS (75 MG TOTAL) TAKE 1 PILL IN AM AND 1/2 PILL AT BEDTIME 11/29/23  Yes Chandrasekhar, Mahesh A, MD  valsartan  (DIOVAN ) 40 MG tablet Take 1 tablet (40 mg total) by mouth daily. Please monitor BP. NEED OV. 04/02/24  Yes Santo Stanly LABOR, MD    Physical Exam: Vitals:   04/10/24 0505 04/10/24 0604 04/10/24 1000 04/10/24 1200  BP:    126/64  Pulse: 88  84 91  Resp: 18  17 13   Temp:  98.2 F (36.8 C)    TempSrc:  Oral    SpO2: 99%  100% 98%   General: Alert, oriented x3, resting comfortably in no acute distress Respiratory: Bibasilar rales; no wheezing Cardiovascular: Regular rate and rhythm w/o m/r/g Abdomen: Soft, nontender, nondistended. Positive bowel sounds MSK: LLE > RLE swelling  Data Reviewed:  Lab Results  Component Value Date   WBC 9.9 04/10/2024   HGB 14.9 04/10/2024   HCT 44.6 04/10/2024   MCV 83.7 04/10/2024   PLT 276 04/10/2024   Lab Results  Component Value Date   GLUCOSE 111 (H) 04/10/2024   CALCIUM 8.4 (L) 04/10/2024   NA 137 04/10/2024   K 3.9 04/10/2024   CO2 24 04/10/2024   CL 105 04/10/2024   BUN 10 04/10/2024   CREATININE 0.96 04/10/2024   Lab Results  Component Value Date   ALT 17 08/08/2018   AST 14 (L) 08/08/2018   ALKPHOS 46 (L) 08/08/2018   BILITOT 0.5 08/08/2018   No results found for: INR, PROTIME  Radiology: VAS US  LOWER EXTREMITY VENOUS (DVT) Result Date: 04/10/2024  Lower Venous DVT Study Patient Name:  Devin Marshall  Date of Exam:   04/10/2024 Medical Rec #: [id number]        Accession #:    [id number] Date of Birth: 06/17/2001         Patient Gender: M Patient Age:   79 years Exam Location:  [facility] Procedure:       VAS US  LOWER EXTREMITY VENOUS (DVT) Referring Phys: LYNWOOD SCHILLING --------------------------------------------------------------------------------  Indications: Pain.  Risk Factors: None identified. Limitations: Body habitus and poor ultrasound/tissue interface. Comparison Study: No prior studies. Performing Technologist: Cordella Collet RVT  Examination Guidelines: A complete evaluation includes B-mode imaging, spectral Doppler, color Doppler, and power Doppler as needed of all accessible portions of each vessel. Bilateral testing is considered an integral part of a complete examination. Limited examinations for reoccurring indications may be performed as noted. The reflux portion of the exam is performed with the patient in reverse Trendelenburg.  +---------+---------------+---------+-----------+----------+-------------------+ RIGHT    CompressibilityPhasicitySpontaneityPropertiesThrombus Aging      +---------+---------------+---------+-----------+----------+-------------------+ CFV      Full           Yes      Yes                                      +---------+---------------+---------+-----------+----------+-------------------+ SFJ      Full                                                             +---------+---------------+---------+-----------+----------+-------------------+ FV Prox  Full                                                             +---------+---------------+---------+-----------+----------+-------------------+ FV Mid   Full                                                             +---------+---------------+---------+-----------+----------+-------------------+ FV Distal               Yes      Yes                                      +---------+---------------+---------+-----------+----------+-------------------+ PFV      Full                                                              +---------+---------------+---------+-----------+----------+-------------------+ POP      Full           Yes      Yes                                      +---------+---------------+---------+-----------+----------+-------------------+ PTV      Full                                                             +---------+---------------+---------+-----------+----------+-------------------+ PERO  Not well visualized +---------+---------------+---------+-----------+----------+-------------------+   +---------+---------------+---------+-----------+----------+-------------------+ LEFT     CompressibilityPhasicitySpontaneityPropertiesThrombus Aging      +---------+---------------+---------+-----------+----------+-------------------+ CFV      Full           Yes      Yes                                      +---------+---------------+---------+-----------+----------+-------------------+ SFJ      Full                                                             +---------+---------------+---------+-----------+----------+-------------------+ FV Prox  Full                                                             +---------+---------------+---------+-----------+----------+-------------------+ FV Mid   Full                                                             +---------+---------------+---------+-----------+----------+-------------------+ FV DistalFull           Yes      Yes                                      +---------+---------------+---------+-----------+----------+-------------------+ PFV      Full                                                             +---------+---------------+---------+-----------+----------+-------------------+ POP      Full           Yes      Yes                                      +---------+---------------+---------+-----------+----------+-------------------+  PTV      Full                                                             +---------+---------------+---------+-----------+----------+-------------------+ PERO                                                  Not well visualized +---------+---------------+---------+-----------+----------+-------------------+    Summary: RIGHT: - There is no  evidence of deep vein thrombosis in the lower extremity. However, portions of this examination were limited- see technologist comments above.  - No cystic structure found in the popliteal fossa.  LEFT: - There is no evidence of deep vein thrombosis in the lower extremity. However, portions of this examination were limited- see technologist comments above.  - No cystic structure found in the popliteal fossa.  *See table(s) above for measurements and observations.    Preliminary    CT Angio Chest PE W and/or Wo Contrast Result Date: 04/10/2024 CLINICAL DATA:  23 year old male with acute chest pain. Shortness of breath and dizziness. History of hypertrophic cardiomyopathy. EXAM: CT ANGIOGRAPHY CHEST WITH CONTRAST TECHNIQUE: Multidetector CT imaging of the chest was performed using the standard protocol during bolus administration of intravenous contrast. Multiplanar CT image reconstructions and MIPs were obtained to evaluate the vascular anatomy. RADIATION DOSE REDUCTION: This exam was performed according to the departmental dose-optimization program which includes automated exposure control, adjustment of the mA and/or kV according to patient size and/or use of iterative reconstruction technique. CONTRAST:  75mL OMNIPAQUE  IOHEXOL  350 MG/ML SOLN COMPARISON:  Chest radiographs 0044 hours today. FINDINGS: Cardiovascular: Suboptimal contrast bolus timing in the pulmonary arterial tree. Two hundred Hounsfield units in the main pulmonary artery, with respiratory motion degrading detail of segmental and distal pulmonary arteries in both lungs. No central or saddle  embolus. No convincing lobar thrombus. Other pulmonary artery branches are not well evaluated. Cardiac enlargement with left ventricular myocardial hypertrophy suggested on series 7, image 235. No pericardial effusion. Negative visible aorta. Mediastinum/Nodes: Negative. No mediastinal mass or lymphadenopathy. Lungs/Pleura: Major airways are patent. Mild respiratory motion. Mild mosaic attenuation in both lungs. No consolidation or pleural effusion. Upper Abdomen: Negative visible mostly noncontrast liver, spleen, pancreas, adrenal glands, kidneys, and bowel in the upper abdomen. Musculoskeletal: Negative. Review of the MIP images confirms the above findings. IMPRESSION: 1. Limited CTA for PE due to contrast timing and respiratory motion. No central or saddle pulmonary embolus. 2. 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 No pulmonary edema or inflammation identified. Electronically Signed   By: VEAR Hurst M.D.   On: 04/10/2024 04:45   DG Chest 2 View Result Date: 04/10/2024 CLINICAL DATA:  Chest pain EXAM: CHEST - 2 VIEW COMPARISON:  08/08/2018 FINDINGS: Cardiac shadow is mildly prominent. Slight increase in vascular congestion is noted without significant edema. No focal infiltrate or effusion is noted. No bony abnormality is seen. IMPRESSION: Mild vascular congestion without edema. Electronically Signed   By: Oneil Devonshire M.D.   On: 04/10/2024 01:17    Assessment and Plan: 53M h/o ADHD, HOCM, OSA, asthma, morbid obesity, prior eating disorder, and anxiety/depression p/w chest pain and c/f NSTEMI.  NSTEMI Angina H/o HCOM CTA PE protocol inconclusive; BLE DVT US  neg -Cards consulted; recs: -D/c IV hep gtt for now -Increase pta metoprolol  XL 50-->100mg  daily -PTA ARB and lasix  -F/u TTE -Will need OP cards f/u for 14d holter monitor  Asthma -Duonebs prn   Advance Care Planning:   Code Status: Full Code   Consults: Cards  Family  Communication: Mother  Severity of Illness: The appropriate patient status for this patient is OBSERVATION. Observation status is judged to be reasonable and necessary in order to provide the required intensity of service to ensure the patient's safety. The patient's presenting symptoms, physical exam findings, and initial radiographic and laboratory data in the context of their medical condition is felt to place them at  decreased risk for further clinical deterioration. Furthermore, it is anticipated that the patient will be medically stable for discharge from the hospital within 2 midnights of admission.    ------- I spent 55 minutes reviewing previous notes, at the bedside counseling/discussing the treatment plan, and performing clinical documentation.  Author: Marsha Ada, MD 04/10/2024 12:24 PM  For on call review [email or web address].

## 2024-04-10 NOTE — Plan of Care (Signed)

## 2024-04-10 NOTE — Progress Notes (Signed)
  Carryover admission to the Day Admitter.  I discussed this case with the EDP, Ubaldo High, PA.  Per these discussions:   This is a 23 year old male with history of hokum, obesity, who is being admitted for chest pain, elevated troponin.  Patient history of HOC experienced acute onset chest pain around 2300 on 8/12/2025M,, associated with shortness of breath and diaphoresis.  His initial troponin was in the 500s, with repeat troponin trending up slightly in the low 600s.  EKG without overt evidence of acute ischemic changes.  Continues to experience chest pain in the ED.  EDP discussed patient's case with on-call cardiology, who has seen the patient and are formally consulting.  They feel that ACS is less likely at this time, but recommend admission to the hospitalist service for further evaluation, further Trope trending, with additional recommendations for heparin  drip as well as echocardiogram.  Of note, CTA chest was negative for PE.  Cardiology to continue to follow with additional recommendations pending.  They are not imminently planning on cardiac cath at this time.  Heparin  drip has been initiated in the ED.   I have placed an order for observation to PCU for further evaluation and management of the above.  I have placed some additional preliminary admit orders via the adult multi-morbid admission order set. I have also ordered echocardiogram this morning, repeat troponin around 8 AM.  Prn IV fentanyl  for chest pain.    Eva Pore, DO Hospitalist

## 2024-04-10 NOTE — Progress Notes (Signed)
 Progress Note  Patient Name: Devin Marshall Date of Encounter: 04/10/2024  Primary Cardiologist:   Stanly DELENA Leavens, MD   Subjective   No chest pain.  Seen in consultation earlier this morning.   Inpatient Medications    Scheduled Meds:  Continuous Infusions:  heparin  1,800 Units/hr (04/10/24 0430)   PRN Meds: acetaminophen  **OR** acetaminophen , fentaNYL  (SUBLIMAZE ) injection, naLOXone  (NARCAN )  injection, ondansetron  (ZOFRAN ) IV   Vital Signs    Vitals:   04/10/24 0316 04/10/24 0345 04/10/24 0505 04/10/24 0604  BP: 139/73 123/62    Pulse: 82 87 88   Resp: 14 18 18    Temp:    98.2 F (36.8 C)  TempSrc:    Oral  SpO2: 100% 100% 99%    No intake or output data in the 24 hours ending 04/10/24 0824 There were no vitals filed for this visit.  Telemetry    NSR - Personally Reviewed  ECG    NSR, rate 87, axis WNL, non specific ST changes consistent with repolarization - Personally Reviewed  Physical Exam   GEN: No acute distress.   Neck: No  JVD Cardiac: RRR, 2/6 apical systolic murmur, no diastolic murmurs, rubs, or gallops.  Respiratory: Clear  to auscultation bilaterally. GI: Soft, nontender, non-distended  MS:    Mild edema; No deformity. Neuro:  Nonfocal  Psych: Normal affect   Labs    Chemistry Recent Labs  Lab 04/10/24 0035  NA 137  K 3.9  CL 105  CO2 24  GLUCOSE 111*  BUN 10  CREATININE 0.96  CALCIUM 8.4*  GFRNONAA >60  ANIONGAP 8     Hematology Recent Labs  Lab 04/10/24 0035  WBC 9.9  RBC 5.33  HGB 14.9  HCT 44.6  MCV 83.7  MCH 28.0  MCHC 33.4  RDW 12.6  PLT 276    Cardiac EnzymesNo results for input(s): TROPONINI in the last 168 hours. No results for input(s): TROPIPOC in the last 168 hours.   BNP Recent Labs  Lab 04/10/24 0312  BNP 223.7*     DDimer No results for input(s): DDIMER in the last 168 hours.   Radiology    CT Angio Chest PE W and/or Wo Contrast Result Date: 04/10/2024 CLINICAL  DATA:  23 year old male with acute chest pain. Shortness of breath and dizziness. History of hypertrophic cardiomyopathy. EXAM: CT ANGIOGRAPHY CHEST WITH CONTRAST TECHNIQUE: Multidetector CT imaging of the chest was performed using the standard protocol during bolus administration of intravenous contrast. Multiplanar CT image reconstructions and MIPs were obtained to evaluate the vascular anatomy. RADIATION DOSE REDUCTION: This exam was performed according to the departmental dose-optimization program which includes automated exposure control, adjustment of the mA and/or kV according to patient size and/or use of iterative reconstruction technique. CONTRAST:  75mL OMNIPAQUE  IOHEXOL  350 MG/ML SOLN COMPARISON:  Chest radiographs 0044 hours today. FINDINGS: Cardiovascular: Suboptimal contrast bolus timing in the pulmonary arterial tree. Two hundred Hounsfield units in the main pulmonary artery, with respiratory motion degrading detail of segmental and distal pulmonary arteries in both lungs. No central or saddle embolus. No convincing lobar thrombus. Other pulmonary artery branches are not well evaluated. Cardiac enlargement with left ventricular myocardial hypertrophy suggested on series 7, image 235. No pericardial effusion. Negative visible aorta. Mediastinum/Nodes: Negative. No mediastinal mass or lymphadenopathy. Lungs/Pleura: Major airways are patent. Mild respiratory motion. Mild mosaic attenuation in both lungs. No consolidation or pleural effusion. Upper Abdomen: Negative visible mostly noncontrast liver, spleen, pancreas, adrenal glands, kidneys, and bowel in  the upper abdomen. Musculoskeletal: Negative. Review of the MIP images confirms the above findings. IMPRESSION: 1. Limited CTA for PE due to contrast timing and respiratory motion. No central or saddle pulmonary embolus. 2. 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 No pulmonary edema or  inflammation identified. Electronically Signed   By: VEAR Hurst M.D.   On: 04/10/2024 04:45   DG Chest 2 View Result Date: 04/10/2024 CLINICAL DATA:  Chest pain EXAM: CHEST - 2 VIEW COMPARISON:  08/08/2018 FINDINGS: Cardiac shadow is mildly prominent. Slight increase in vascular congestion is noted without significant edema. No focal infiltrate or effusion is noted. No bony abnormality is seen. IMPRESSION: Mild vascular congestion without edema. Electronically Signed   By: Oneil Devonshire M.D.   On: 04/10/2024 01:17    Cardiac Studies   Echo:  Pending    03/26/24  1. Known hx of Hypertrophic cardiomyopathy - Basal-septum is 2.77 cm      Mild SAM. LVOT gradient noted at rest 66 mmHg and with valsalva 104  mmHg. Left ventricular ejection fraction, by estimation, is 60 to 65%. The  left ventricle has normal function. The left ventricle has no regional  wall motion abnormalities. There is  severe concentric left ventricular hypertrophy. Left ventricular diastolic  parameters are indeterminate.   2. Right ventricular systolic function is normal. The right ventricular  size is normal. Tricuspid regurgitation signal is inadequate for assessing  PA pressure.   3. The mitral valve is normal in structure. Mild mitral valve  regurgitation. No evidence of mitral stenosis.   4. The aortic valve is normal in structure. Aortic valve regurgitation is  not visualized. No aortic stenosis is present.   5. The inferior vena cava is normal in size with greater than 50%  respiratory variability, suggesting right atrial pressure of 3 mmHg.    Patient Profile     23 y.o. male with a hx of HOCM, morbid obesity, anxiety/depression, OSA, asthma, prior eating disorder who is being seen 04/10/2024 for the evaluation of chest pain.   Assessment & Plan    Chest pain:    No obvious PE but could not be completely excluded secondary to image quality.  I would suggest lower extremity Doppler to rule out DVT.  Await echo  to look for effusion, signs of pericarditis.  Despite elevated trop I am not suspecting an acute coronary syndrome.  I am not planning cardiac cath.  Repeat enzymes and EKG.   Palpitations. He is two days into a 14 day monitor.  It is not live and so cannot be interrogated live.  We will wait for the final results in a few weeks.  I suspect that this event could have been primarily a rhythm event.    For questions or updates, please contact [facility] Please consult [email or web address] for contact info under Cardiology/STEMI.   Signed, Lynwood Schilling, MD  04/10/2024, 8:24 AM

## 2024-04-10 NOTE — ED Notes (Signed)
 Informed by lab of elevated troponin; per chart review pt has just left from the lobby. Informed Ubaldo PA of critical results and able to contact pt directly via cellphone. Mother and patient will return

## 2024-04-10 NOTE — Consult Note (Signed)
 Cardiology Consultation   Patient ID: Devin Marshall MRN: [id number]; DOB: [birth year]  Admit date: 04/10/2024 Date of Consult: 04/10/2024  PCP:  Lucius Krabbe, NP   [facility] Providers Cardiologist:  Stanly DELENA Leavens, MD     Patient Profile: Devin Marshall is a 23 y.o. male with a hx of HOCM, morbid obesity, anxiety/depression, OSA, asthma, prior eating disorder who is being seen 04/10/2024 for the evaluation of chest pain.    History of Present Illness: [name] reports that last night while he was sitting watching videos, he had an episode of lightheadedness and heart thumping sensation that lasted for 10-15 seconds. Subsequently, he started to have sharp, non-radiating, and non-exertional chest pain across his chest that's associated with SOB. No prior similar episodes. He took ASA 325 mg prior to coming to the ED. He's currently wearing an event monitor.   EKG showed NSR with no acute ischemic changes. Troponin  577. CBC and BMP were normal.   Past Medical History:  Diagnosis Date   ADHD (attention deficit hyperactivity disorder)    Allergy    Anxiety    Asthma    Asthma    Phreesia 01/28/2020   BMI (body mass index), pediatric, > 99% for age 15/05/2015   Conjunctivitis 05/08/2013   Depression    Eating disorder    Eczema    Encounter for routine child health examination without abnormal findings 01/30/2020   Morbid obesity (HCC)    Obesity    Situational anxiety    Sleep apnea    Undiagnosed cardiac murmurs 12/10/2012    Past Surgical History:  Procedure Laterality Date   CIRCUMCISION       Home Medications:  Prior to Admission medications   Medication Sig Start Date End Date Taking? Authorizing Provider  benzonatate  (TESSALON ) 100 MG capsule Take 1 capsule (100 mg total) by mouth 3 (three) times daily as needed for cough. 02/19/24   Vonna Sharlet POUR, MD  budesonide  (PULMICORT ) 1 MG/2ML nebulizer solution TAKE 2 ML (1 MG TOTAL) BY  NEBULIZATION DAILY 10/27/23   Lucius Krabbe, NP  furosemide  (LASIX ) 20 MG tablet Take 1 tablet (20 mg total) by mouth daily as needed (leg swelling). 02/15/23   Leavens Stanly DELENA, MD  levalbuterol  (XOPENEX  HFA) 45 MCG/ACT inhaler Inhale 1-2 puffs into the lungs every 8 (eight) hours as needed for wheezing or shortness of breath. 09/12/23   Defelice, Jeanette, NP  metoprolol  succinate (TOPROL -XL) 50 MG 24 hr tablet TAKE 1.5 TABLETS (75 MG TOTAL) TAKE 1 PILL IN AM AND 1/2 PILL AT BEDTIME 11/29/23   Leavens Stanly A, MD  pantoprazole  (PROTONIX ) 40 MG tablet Please see PCP for refills 02/15/23   Leavens Stanly A, MD  tirzepatide  (ZEPBOUND ) 10 MG/0.5ML Pen Inject 10 mg into the skin once a week. 08/07/23   Chandrasekhar, Stanly DELENA, MD  valsartan  (DIOVAN ) 40 MG tablet Take 1 tablet (40 mg total) by mouth daily. Please monitor BP. NEED OV. 04/02/24   Leavens Stanly DELENA, MD    Scheduled Meds:  Continuous Infusions:  PRN Meds:   Allergies:    Allergies  Allergen Reactions   Other Itching    Had a reaction to flu shot in 2009, 2010 and 2011 had flu mist with no reaction then 2012 had flu shot again and developed itching and redness Case reported to [facility].    Social History:   Social History   Socioeconomic History   Marital status: Single    Spouse name: Not on file  Number of children: 0   Years of education: 12   Highest education level: High school graduate  Occupational History   Occupation: Consulting civil engineer    Comment: student   Occupation: unemployed  Tobacco Use   Smoking status: Never    Passive exposure: Yes   Smokeless tobacco: Never   Tobacco comments:    both parents smoke indoors and outdoors.  Vaping Use   Vaping status: Never Used  Substance and Sexual Activity   Alcohol use: Never   Drug use: Never   Sexual activity: Never  Other Topics Concern   Not on file  Social History Narrative   Lives with mom and dad and brother   Gradated from [employer] in 2022. Updated 05/20/21 [facility]                           Social Drivers of Corporate investment banker Strain: Not on file  Food Insecurity: Not on file  Transportation Needs: Unmet Transportation Needs (02/28/2023)   PRAPARE - Administrator, Civil Service (Medical): Yes    Lack of Transportation (Non-Medical): Yes  Physical Activity: Not on file  Stress: Not on file  Social Connections: Not on file  Intimate Partner Violence: Not on file    Family History:   Family History  Problem Relation Age of Onset   Hypertension Mother    Asthma Mother    Sleep apnea Mother    Diabetes Mother    Hyperlipidemia Mother    Obesity Mother    Hypertension Maternal Grandmother    Diabetes Paternal Grandmother    Asthma Brother        wheezing, young age   Sleep apnea Brother    Diabetes type I Brother    Drug abuse Father    Heart disease Neg Hx    Stroke Neg Hx    Depression Neg Hx    Kidney disease Neg Hx    Alcohol abuse Neg Hx    Arthritis Neg Hx    Birth defects Neg Hx    Cancer Neg Hx    COPD Neg Hx    Early death Neg Hx    Hearing loss Neg Hx    Learning disabilities Neg Hx    Mental illness Neg Hx    Mental retardation Neg Hx    Miscarriages / Stillbirths Neg Hx    Vision loss Neg Hx    Varicose Veins Neg Hx      ROS:  Please see the history of present illness.  All other ROS reviewed and negative.     Physical Exam/Data: Vitals:   04/10/24 0015  BP: 133/74  Pulse: 94  Resp: (!) 24  Temp: 98.7 F (37.1 C)  SpO2: 95%   No intake or output data in the 24 hours ending 04/10/24 0302    09/12/2023    6:42 PM 09/06/2023    3:11 PM 03/31/2023   11:26 AM  Last 3 Weights  Weight (lbs) 460 lb 448 lb 3.2 oz 462 lb  Weight (kg) 208.655 kg 203.302 kg 209.562 kg     There is no height or weight on file to calculate BMI.  General:  Well nourished, well developed, in no acute distress HEENT: normal Neck: no JVD Vascular: Distal pulses 2+  bilaterally Cardiac:  normal S1, S2; RRR; no murmur  Lungs:  clear to auscultation bilaterally, no wheezing, rhonchi or rales  Abd: soft,  nontender, no hepatomegaly  Ext: no edema Musculoskeletal:  No deformities, BUE and BLE strength normal and equal Skin: warm and dry  Neuro:  CNs 2-12 intact, no focal abnormalities noted Psych:  Normal affect   EKG:  The EKG was personally reviewed and demonstrates:  normal sinus rhythm with no acute ischemic changes  Relevant CV Studies: TTE 03/26/2024   1. Known hx of Hypertrophic cardiomyopathy - Basal-septum is 2.77 cm. Mild SAM. LVOT gradient noted at rest 66 mmHg and with valsalva 104 mmHg. Left ventricular ejection fraction, by estimation, is 60 to 65%. The left ventricle has normal function. The left ventricle has no regional wall motion abnormalities. There is severe concentric left ventricular hypertrophy. Left ventricular diastolic parameters are indeterminate.   2. Right ventricular systolic function is normal. The right ventricular  size is normal. Tricuspid regurgitation signal is inadequate for assessing  PA pressure.   3. The mitral valve is normal in structure. Mild mitral valve  regurgitation. No evidence of mitral stenosis.   4. The aortic valve is normal in structure. Aortic valve regurgitation is  not visualized. No aortic stenosis is present.   5. The inferior vena cava is normal in size with greater than 50%  respiratory variability, suggesting right atrial pressure of 3 mmHg.   Conclusion(s)/Recommendation(s): Known hx of HCM, echo enchancing image  not specific for spade-like shape to suggest apical HCM but can not  exclude this diagnosis. May benefit from cardiac MR.   Laboratory Data: High Sensitivity Troponin:   Recent Labs  Lab 04/10/24 0035  TROPONINIHS 577*     Chemistry Recent Labs  Lab 04/10/24 0035  NA 137  K 3.9  CL 105  CO2 24  GLUCOSE 111*  BUN 10  CREATININE 0.96  CALCIUM 8.4*  GFRNONAA >60   ANIONGAP 8    No results for input(s): PROT, ALBUMIN, AST, ALT, ALKPHOS, BILITOT in the last 168 hours. Lipids No results for input(s): CHOL, TRIG, HDL, LABVLDL, LDLCALC, CHOLHDL in the last 168 hours.  Hematology Recent Labs  Lab 04/10/24 0035  WBC 9.9  RBC 5.33  HGB 14.9  HCT 44.6  MCV 83.7  MCH 28.0  MCHC 33.4  RDW 12.6  PLT 276   Thyroid No results for input(s): TSH, FREET4 in the last 168 hours.  BNPNo results for input(s): BNP, PROBNP in the last 168 hours.  DDimer No results for input(s): DDIMER in the last 168 hours.  Radiology/Studies:  DG Chest 2 View Result Date: 04/10/2024 CLINICAL DATA:  Chest pain EXAM: CHEST - 2 VIEW COMPARISON:  08/08/2018 FINDINGS: Cardiac shadow is mildly prominent. Slight increase in vascular congestion is noted without significant edema. No focal infiltrate or effusion is noted. No bony abnormality is seen. IMPRESSION: Mild vascular congestion without edema. Electronically Signed   By: Oneil Devonshire M.D.   On: 04/10/2024 01:17     Assessment and Plan: Chest pain Palpitations HOCM His presentation with a 10-15 second episode of heart thumping sensation and lightheadedness followed by sharp chest pain with evidence of myocardial injury is mostly concerning for arrhythmias especially given his hx of HOCM. An obstructive LVOT physiology is another possibility, but the story isn't very suggestive of that. Other etiologies, such as PE, need to be ruled out. ACS is less likely with his age but not totally ruled out.  - Please obtain CT PE protocol to rule out PE - Increase metoprolol  succinate from 50 mg (prescribed 75 mg daily but he takes 50 mg daily  only) to 100 mg daily for HCM - Continue Valsartan  40 mg daily  - Although ACS is less likely, reasonable to place him on heparin  gtt for now until it's totally ruled out. He already had ASA 325 mg prior to coming to the ED. Will also follow his second troponin to  see the delta.  - Repeat TTE in the AM. - He already has Zio on, will ask for interrogation in the AM  For questions or updates, please contact [facility] Please consult [email or web address] for contact info under    Signed, Gillian CHRISTELLA Cass, MD  04/10/2024 3:02 AM

## 2024-04-10 NOTE — Group Note (Deleted)
 Date:  04/10/2024 Time:  2:22 PM  Group Topic/Focus:  Wellness Toolbox:   The focus of this group is to discuss various aspects of wellness, balancing those aspects and exploring ways to increase the ability to experience wellness.  Patients will create a wellness toolbox for use upon discharge.     Participation Level:  {BHH PARTICIPATION OZCZO:77735}  Participation Quality:  {BHH PARTICIPATION QUALITY:22265}  Affect:  {BHH AFFECT:22266}  Cognitive:  {BHH COGNITIVE:22267}  Insight: {BHH Insight2:20797}  Engagement in Group:  {BHH ENGAGEMENT IN HMNLE:77731}  Modes of Intervention:  {BHH MODES OF INTERVENTION:22269}  Additional Comments:  ***  Myra Curtistine BROCKS 04/10/2024, 2:22 PM

## 2024-04-10 NOTE — Progress Notes (Signed)
 PHARMACY - ANTICOAGULATION CONSULT NOTE  Pharmacy Consult for Heparin   Indication: chest pain/ACS, rule out pulmonary embolus  Allergies  Allergen Reactions   Other Itching    Had a reaction to flu shot in 2009, 2010 and 2011 had flu mist with no reaction then 2012 had flu shot again and developed itching and redness Case reported to [facility].   Vital Signs: Temp: 98.7 F (37.1 C) (08/13 0015) BP: 123/62 (08/13 0345) Pulse Rate: 87 (08/13 0345)  Labs: Recent Labs    04/10/24 0035 04/10/24 0303  HGB 14.9  --   HCT 44.6  --   PLT 276  --   CREATININE 0.96  --   TROPONINIHS 577* 612*    CrCl cannot be calculated (Unknown ideal weight.).   Medical History: Past Medical History:  Diagnosis Date   ADHD (attention deficit hyperactivity disorder)    Allergy    Anxiety    Asthma    Asthma    Phreesia 01/28/2020   BMI (body mass index), pediatric, > 99% for age 35/05/2015   Conjunctivitis 05/08/2013   Depression    Eating disorder    Eczema    Encounter for routine child health examination without abnormal findings 01/30/2020   Morbid obesity (HCC)    Obesity    Situational anxiety    Sleep apnea    Undiagnosed cardiac murmurs 12/10/2012   Assessment: 23 y/o M with chest pain, shortness of breath, and elevated troponin. Starting heparin  for rule out PE vs ACS. PTA meds reviewed. Labs above reviewed.   Goal of Therapy:  Heparin  level 0.3-0.7 units/ml Monitor platelets by anticoagulation protocol: Yes   Plan:  Heparin  7000 units BOLUS Start heparin  drip at 1800 units/hr Heparin  level in 8 hours Daily CBC/Heparin  level Monitor for bleeding F/U CT Angio for rule out PE  Lynwood Mckusick, PharmD, BCPS Clinical Pharmacist Phone: [phone]

## 2024-04-11 ENCOUNTER — Observation Stay (HOSPITAL_BASED_OUTPATIENT_CLINIC_OR_DEPARTMENT_OTHER)

## 2024-04-11 DIAGNOSIS — R079 Chest pain, unspecified: Secondary | ICD-10-CM | POA: Diagnosis not present

## 2024-04-11 DIAGNOSIS — R072 Precordial pain: Secondary | ICD-10-CM | POA: Diagnosis not present

## 2024-04-11 DIAGNOSIS — I2510 Atherosclerotic heart disease of native coronary artery without angina pectoris: Secondary | ICD-10-CM | POA: Diagnosis not present

## 2024-04-11 DIAGNOSIS — R0789 Other chest pain: Secondary | ICD-10-CM | POA: Diagnosis not present

## 2024-04-11 DIAGNOSIS — R7989 Other specified abnormal findings of blood chemistry: Secondary | ICD-10-CM | POA: Diagnosis not present

## 2024-04-11 DIAGNOSIS — J45909 Unspecified asthma, uncomplicated: Secondary | ICD-10-CM | POA: Diagnosis not present

## 2024-04-11 LAB — ECHOCARDIOGRAM COMPLETE
Height: 67 in
MV M vel: 6.22 m/s
MV Peak grad: 154.8 mmHg
S' Lateral: 3 cm
Weight: 7107.63 [oz_av]

## 2024-04-11 MED ORDER — METOPROLOL SUCCINATE ER 100 MG PO TB24: 100.0000 mg | ORAL_TABLET | Freq: Every day | ORAL | 0 refills | Status: DC

## 2024-04-11 NOTE — Progress Notes (Signed)
  Echocardiogram 2D Echocardiogram has been performed.  LAMON MAXWELL 04/11/2024, 8:58 AM

## 2024-04-11 NOTE — TOC CM/SW Note (Signed)
 Transition of Care [facility]) - Inpatient Brief Assessment   Patient Details  Name: Devin Marshall MRN: [id number] Date of Birth: 12/31/2000  Transition of Care [facility]) CM/SW Contact:    Lauraine FORBES Saa, LCSWA Phone Number: 04/11/2024, 10:25 AM   Clinical Narrative:  10:25 AM Per chart review, patient resides at home with parent(s). Patient has a PCP and insurance. Patient does not have SNF or HH history. Patient has DME/CPAP through [facility]. Patient's preferred pharmacy's are [facility], [facility], and [facility] [phone] [facility]. No TOC needs were identified at this time. TOC will continue to follow and be available to assist.  Transition of Care Asessment: Insurance and Status: Insurance coverage has been reviewed Patient has primary care physician: Yes Home environment has been reviewed: Private Residence Prior level of function:: N/A Prior/Current Home Services: No current home services Social Drivers of Health Review: SDOH reviewed no interventions necessary Readmission risk has been reviewed: Yes (Currently Observation Status) Transition of care needs: no transition of care needs at this time

## 2024-04-11 NOTE — TOC Transition Note (Signed)
 Transition of Care [facility]) - Discharge Note   Patient Details  Name: Devin Marshall MRN: [id number] Date of Birth: 10-03-2000  Transition of Care [facility]) CM/SW Contact:  Roxie KANDICE Stain, RN Phone Number: 04/11/2024, 12:13 PM   Clinical Narrative:    Devin Marshall is stable to discharge home.  No TOC needs at this time.    Final next level of care: Home/Self Care Barriers to Discharge: Barriers Resolved   Patient Goals and CMS Choice Patient states their goals for this hospitalization and ongoing recovery are:: return home          Discharge Placement               Home        Discharge Plan and Services Additional resources added to the After Visit Summary for                                       Social Drivers of Health (SDOH) Interventions SDOH Screenings   Food Insecurity: Patient Declined (04/10/2024)  Housing: Unknown (04/11/2024)  Transportation Needs: No Transportation Needs (04/11/2024)  Utilities: Not At Risk (04/11/2024)  Depression (PHQ2-9): Medium Risk (12/23/2022)  Tobacco Use: Medium Risk (04/10/2024)     Readmission Risk Interventions     No data to display

## 2024-04-11 NOTE — Progress Notes (Signed)
 Progress Note  Patient Name: Devin Marshall Age Date of Encounter: 04/11/2024  Primary Cardiologist:   Devin DELENA Leavens, MD   Subjective   No chest pain except after he ate last night.  No SOB.   Inpatient Medications    Scheduled Meds:  furosemide   20 mg Oral Daily   irbesartan   37.5 mg Oral Daily   metoprolol  succinate  100 mg Oral Daily   Continuous Infusions:   PRN Meds: acetaminophen  **OR** acetaminophen , fentaNYL  (SUBLIMAZE ) injection, ipratropium-albuterol , naLOXone  (NARCAN )  injection, ondansetron  (ZOFRAN ) IV   Vital Signs    Vitals:   04/10/24 1947 04/10/24 2315 04/11/24 0414 04/11/24 0723  BP: 116/68 (!) 140/88 (!) 116/59 (!) 150/84  Pulse: 87 79 93 87  Resp: 16 19 20 20   Temp: 97.7 F (36.5 C) 97.7 F (36.5 C) 98.5 F (36.9 C) 97.6 F (36.4 C)  TempSrc: Oral Oral Oral Oral  SpO2: 95% 97% 95% 90%  Weight:   (!) 201.5 kg   Height:        Intake/Output Summary (Last 24 hours) at 04/11/2024 0744 Last data filed at 04/11/2024 0400 Gross per 24 hour  Intake 360 ml  Output 1000 ml  Net -640 ml   Filed Weights   04/10/24 1504 04/11/24 0414  Weight: (!) 202.3 kg (!) 201.5 kg    Telemetry    NSR, one ventricular triplet - Personally Reviewed  ECG    NSR, rate 81, no acute ST T wave changes. No significant change from EKG yesterday.  - Personally Reviewed  Physical Exam   GEN: No  acute distress.   Neck: No  JVD Cardiac: RRR, systolic murmur unchanged from admission, no diastolic murmurs, rubs, or gallops.  Respiratory: Clear   to auscultation bilaterally. GI: Soft, nontender, non-distended, normal bowel sounds  MS:  No edema; No deformity. Neuro:   Nonfocal  Psych: Oriented and appropriate    Labs    Chemistry Recent Labs  Lab 04/10/24 0035  NA 137  K 3.9  CL 105  CO2 24  GLUCOSE 111*  BUN 10  CREATININE 0.96  CALCIUM 8.4*  GFRNONAA >60  ANIONGAP 8     Hematology Recent Labs  Lab 04/10/24 0035  WBC 9.9  RBC 5.33   HGB 14.9  HCT 44.6  MCV 83.7  MCH 28.0  MCHC 33.4  RDW 12.6  PLT 276    Cardiac EnzymesNo results for input(s): TROPONINI in the last 168 hours. No results for input(s): TROPIPOC in the last 168 hours.   BNP Recent Labs  Lab 04/10/24 0312  BNP 223.7*     DDimer No results for input(s): DDIMER in the last 168 hours.   Radiology    VAS US  LOWER EXTREMITY VENOUS (DVT) Result Date: 04/10/2024  Lower Venous DVT Study Patient Name:  Devin Marshall  Date of Exam:   04/10/2024 Medical Rec #: [id number]        Accession #:    [id number] Date of Birth: 12/09/2000         Patient Gender: M Patient Age:   22 years Exam Location:  [facility] Procedure:      VAS US  LOWER EXTREMITY VENOUS (DVT) Referring Phys: LYNWOOD SCHILLING --------------------------------------------------------------------------------  Indications: Pain.  Risk Factors: None identified. Limitations: Body habitus and poor ultrasound/tissue interface. Comparison Study: No prior studies. Performing Technologist: Cordella Collet RVT  Examination Guidelines: A complete evaluation includes B-mode imaging, spectral Doppler, color Doppler, and power Doppler as needed of all accessible portions  of each vessel. Bilateral testing is considered an integral part of a complete examination. Limited examinations for reoccurring indications may be performed as noted. The reflux portion of the exam is performed with the patient in reverse Trendelenburg.  +---------+---------------+---------+-----------+----------+-------------------+ RIGHT    CompressibilityPhasicitySpontaneityPropertiesThrombus Aging      +---------+---------------+---------+-----------+----------+-------------------+ CFV      Full           Yes      Yes                                      +---------+---------------+---------+-----------+----------+-------------------+ SFJ      Full                                                              +---------+---------------+---------+-----------+----------+-------------------+ FV Prox  Full                                                             +---------+---------------+---------+-----------+----------+-------------------+ FV Mid   Full                                                             +---------+---------------+---------+-----------+----------+-------------------+ FV Distal               Yes      Yes                                      +---------+---------------+---------+-----------+----------+-------------------+ PFV      Full                                                             +---------+---------------+---------+-----------+----------+-------------------+ POP      Full           Yes      Yes                                      +---------+---------------+---------+-----------+----------+-------------------+ PTV      Full                                                             +---------+---------------+---------+-----------+----------+-------------------+ PERO  Not well visualized +---------+---------------+---------+-----------+----------+-------------------+   +---------+---------------+---------+-----------+----------+-------------------+ LEFT     CompressibilityPhasicitySpontaneityPropertiesThrombus Aging      +---------+---------------+---------+-----------+----------+-------------------+ CFV      Full           Yes      Yes                                      +---------+---------------+---------+-----------+----------+-------------------+ SFJ      Full                                                             +---------+---------------+---------+-----------+----------+-------------------+ FV Prox  Full                                                             +---------+---------------+---------+-----------+----------+-------------------+ FV  Mid   Full                                                             +---------+---------------+---------+-----------+----------+-------------------+ FV DistalFull           Yes      Yes                                      +---------+---------------+---------+-----------+----------+-------------------+ PFV      Full                                                             +---------+---------------+---------+-----------+----------+-------------------+ POP      Full           Yes      Yes                                      +---------+---------------+---------+-----------+----------+-------------------+ PTV      Full                                                             +---------+---------------+---------+-----------+----------+-------------------+ PERO                                                  Not well visualized +---------+---------------+---------+-----------+----------+-------------------+     Summary: RIGHT: - There is  no evidence of deep vein thrombosis in the lower extremity. However, portions of this examination were limited- see technologist comments above.  - No cystic structure found in the popliteal fossa.  LEFT: - There is no evidence of deep vein thrombosis in the lower extremity. However, portions of this examination were limited- see technologist comments above.  - No cystic structure found in the popliteal fossa.  *See table(s) above for measurements and observations. Electronically signed by Norman Serve on 04/10/2024 at 2:58:19 PM.    Final    CT Angio Chest PE W and/or Wo Contrast Result Date: 04/10/2024 CLINICAL DATA:  24 year old male with acute chest pain. Shortness of breath and dizziness. History of hypertrophic cardiomyopathy. EXAM: CT ANGIOGRAPHY CHEST WITH CONTRAST TECHNIQUE: Multidetector CT imaging of the chest was performed using the standard protocol during bolus administration of intravenous contrast. Multiplanar CT  image reconstructions and MIPs were obtained to evaluate the vascular anatomy. RADIATION DOSE REDUCTION: This exam was performed according to the departmental dose-optimization program which includes automated exposure control, adjustment of the mA and/or kV according to patient size and/or use of iterative reconstruction technique. CONTRAST:  75mL OMNIPAQUE  IOHEXOL  350 MG/ML SOLN COMPARISON:  Chest radiographs 0044 hours today. FINDINGS: Cardiovascular: Suboptimal contrast bolus timing in the pulmonary arterial tree. Two hundred Hounsfield units in the main pulmonary artery, with respiratory motion degrading detail of segmental and distal pulmonary arteries in both lungs. No central or saddle embolus. No convincing lobar thrombus. Other pulmonary artery branches are not well evaluated. Cardiac enlargement with left ventricular myocardial hypertrophy suggested on series 7, image 235. No pericardial effusion. Negative visible aorta. Mediastinum/Nodes: Negative. No mediastinal mass or lymphadenopathy. Lungs/Pleura: Major airways are patent. Mild respiratory motion. Mild mosaic attenuation in both lungs. No consolidation or pleural effusion. Upper Abdomen: Negative visible mostly noncontrast liver, spleen, pancreas, adrenal glands, kidneys, and bowel in the upper abdomen. Musculoskeletal: Negative. Review of the MIP images confirms the above findings. IMPRESSION: 1. Limited CTA for PE due to contrast timing and respiratory motion. No central or saddle pulmonary embolus. 2. 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 No pulmonary edema or inflammation identified. Electronically Signed   By: VEAR Hurst M.D.   On: 04/10/2024 04:45   DG Chest 2 View Result Date: 04/10/2024 CLINICAL DATA:  Chest pain EXAM: CHEST - 2 VIEW COMPARISON:  08/08/2018 FINDINGS: Cardiac shadow is mildly prominent. Slight increase in vascular congestion is noted without significant edema. No  focal infiltrate or effusion is noted. No bony abnormality is seen. IMPRESSION: Mild vascular congestion without edema. Electronically Signed   By: Oneil Devonshire M.D.   On: 04/10/2024 01:17    Cardiac Studies   Echo  1. Severe asymmetric hypertrophy of the septum up to 2.4 cm on this  study. SAM of MV present. LVOT resting obstruction is present up to 35  mmHG. Findings consistent with HOCM which are known. No provocative  maneuvers performed. Left ventricular ejection   fraction, by estimation, is 65 to 70%. The left ventricle has normal  function. The left ventricle has no regional wall motion abnormalities.  There is severe asymmetric left ventricular hypertrophy of the septal  segment. Indeterminate diastolic filling  due to E-A fusion.   2. Right ventricular systolic function is normal. The right ventricular  size is normal. Tricuspid regurgitation signal is inadequate for assessing  PA pressure.   3. Left atrial size was mild to moderately dilated.   4. The mitral valve  is grossly normal. Trivial mitral valve  regurgitation. No evidence of mitral stenosis.   5. The aortic valve is tricuspid. Aortic valve regurgitation is not  visualized. No aortic stenosis is present.   6. The inferior vena cava is normal in size with greater than 50%  respiratory variability, suggesting right atrial pressure of 3 mmHg.   Patient Profile     24 y.o. male with a hx of HOCM, morbid obesity, anxiety/depression, OSA, asthma, prior eating disorder who is being seen 04/10/2024 for the evaluation of chest pain.   Assessment & Plan    Chest pain:    No obvious PE but could not be completely excluded secondary to image quality.  No evidence of DVT.   Echo with no acute findings as above. .  I am not suspecting acute coronary syndrome.  Follow up as an out patient.    Given the fact that the predominant complaint was palpitations followed by pain we can wait for the report on the monitor that he is wearing  that should have captured this event.  Palpitations. He is three days into a 14 day monitor.  We will have results at follow up in the office.   For questions or updates, please contact [facility] Please consult [email or web address] for contact info under Cardiology/STEMI.   Signed, Lynwood Schilling, MD  04/11/2024, 7:44 AM

## 2024-04-17 NOTE — Discharge Summary (Signed)
 Physician Discharge Summary   Patient: Devin Marshall MRN: [id number] DOB: 16-Mar-2001  Admit date:     04/10/2024  Discharge date: 04/11/2024  Discharge Physician: Brigida Bureau   PCP: Lucius Krabbe, NP   Recommendations at discharge:    Discharge to home.  Follow up with cardiology as outpatient as directed by Dr. Lavona.  Discharge Diagnoses: Principal Problem:   Chest pain Active Problems:   Palpitations   Hypertrophic cardiomyopathy (HCC)  Resolved Problems:   * No resolved hospital problems. [facility] Course: Pt was in [facility] until last night when he had cp. Pt reports acute onset cp at 2300 while watching Youtube videos. The acute cp began as 10/10 then spread across his chest diffusely and was 7/10, before becoming a dull 3/10 pain over his heart. Pt endorses sweaty/clamminess at the time of this event, but denied any n/v, jaw claudication or L shoulder pain. Pt yelled for help as he lives with his parents, and his mother activated EMS. Of note, pt received a heart monitor in the mail on 10/11 (unclear indication).   In the ED, pt tachypneic on RA. Labs notable for BNP 223.7, and troponin 577-->612-->563. EDP consulted cards who agreed with hep gtt for ACS and medicine admission.  The patient was initially placed on a heparin  drip. This was discontinued when the CTA chest failed to clearly demonstrate PE and B/L Dopplers were negative for DVT.SABRA The patient's metoprolol  XL 50 mg was increased to 100 mg daily. He was continued on ARB and lasix .    Echocardiogram was ordered and demonstrated EF of 65-70% with normal LV function and non regional wall motion abnormalities. There is severe asymmetric left bentricular hypertrtophy of the septal segment. There is severe asymmetric hypertropphyof the septum u[p to 2.4 cm . LVOT resting obstruction is present with a pressure up to 35 mmHg. There is evidence of HOCM which is a known diagnosis.  Cardiology was consulted. The patient  was evaluated by Dr. Lavona. At the time of the evaluation the patient had no further chest pain and no further shortness of breath. There was no obvious PE, but this could not be completely exculded due to image quality. ACS was not suspected.   The patient was 3 days into a 14 day monitor. Cardiology would seen the patient as outpatient to follow up on results.   As the patient had no further chest pain and no further shortness of breath, and as he had been cleared for discharge by cardiology, the patient was discharged to home in fair condition.  Assessment and Plan: 65M h/o ADHD, HOCM, OSA, asthma, morbid obesity, prior eating disorder, and anxiety/depression p/w chest pain and c/f NSTEMI.   NSTEMI Angina H/o HCOM CTA PE protocol inconclusive; BLE DVT US  neg. Heparin  discontinued. -Cards consulted; recs: -D/c IV hep gtt for now -Increase pta metoprolol  XL 50-->100mg  daily -PTA ARB and lasix  -TTE has demonstrated EF of 65-70% with normal LV function and non regional wall motion abnormalities. There is severe asymmetric left bentricular hypertrtophy of the septal segment. There is severe asymmetric hypertropphyof the septum u[p to 2.4 cm . LVOT resting obstruction is present with a pressure up to 35 mmHg. There is evidence of HOCM which is a known diagnosis. - Patient cleared for discharge by cardiology.  -Will need OP cards f/u for 14d holter monitor   Asthma -Duonebs prn     Advance Care Planning:   Code Status: Full Code    Consults: Cards  Family Communication:  None available   Disposition: Home Diet recommendation:  Discharge Diet Orders (From admission, onward)     Start     Ordered   04/11/24 0000  Diet - low sodium heart healthy        04/11/24 1136           Cardiac diet DISCHARGE MEDICATION: Allergies as of 04/11/2024       Reactions   Other Itching   Had a reaction to flu shot in 2009, 2010 and 2011 had flu mist with no reaction then 2012 had flu  shot again and developed itching and redness Case reported to [facility].        Medication List     TAKE these medications    benzonatate  100 MG capsule Commonly known as: TESSALON  Take 1 capsule (100 mg total) by mouth 3 (three) times daily as needed for cough.   budesonide  1 MG/2ML nebulizer solution Commonly known as: PULMICORT  TAKE 2 ML (1 MG TOTAL) BY NEBULIZATION DAILY   furosemide  20 MG tablet Commonly known as: LASIX  Take 1 tablet (20 mg total) by mouth daily as needed (leg swelling). What changed: when to take this   ibuprofen  200 MG tablet Commonly known as: ADVIL  Take 400 mg by mouth every 6 (six) hours as needed for mild pain (pain score 1-3) or headache.   levalbuterol  45 MCG/ACT inhaler Commonly known as: XOPENEX  HFA Inhale 1-2 puffs into the lungs every 8 (eight) hours as needed for wheezing or shortness of breath.   metoprolol  succinate 100 MG 24 hr tablet Commonly known as: TOPROL -XL Take 1 tablet (100 mg total) by mouth daily. Take with or immediately following a meal. What changed:  medication strength how much to take how to take this when to take this additional instructions   valsartan  40 MG tablet Commonly known as: DIOVAN  Take 1 tablet (40 mg total) by mouth daily. Please monitor BP. NEED OV.        Discharge Exam: Filed Weights   04/10/24 1504 04/11/24 0414  Weight: (!) 202.3 kg (!) 201.5 kg   Exam:  Constitutional:  The patient is awake, alert, and oriented x 3. No acute distress. Eyes:  pupils and irises appear normal Normal lids and conjunctivae ENMT:  grossly normal hearing  Lips appear normal external ears, nose appear normal Oropharynx: mucosa, tongue,posterior pharynx appear normal Neck:  neck appears normal, no masses, normal ROM, supple no thyromegaly Respiratory:  No increased work of breathing. No wheezes, rales, or rhonchi No tactile fremitus Cardiovascular:  Regular rate and rhythm No murmurs, ectopy, or  gallups. No lateral PMI. No thrills. Abdomen:  Abdomen is soft, non-tender, non-distended No hernias, masses, or organomegaly Normoactive bowel sounds.  Musculoskeletal:  No cyanosis, clubbing, or edema Skin:  No rashes, lesions, ulcers palpation of skin: no induration or nodules Neurologic:  CN 2-12 intact Sensation all 4 extremities intact Psychiatric:  Mental status Mood, affect appropriate Orientation to person, place, time  judgment and insight appear intact   Condition at discharge: fair  The results of significant diagnostics from this hospitalization (including imaging, microbiology, ancillary and laboratory) are listed below for reference.   Imaging Studies: ECHOCARDIOGRAM COMPLETE Result Date: 04/11/2024    ECHOCARDIOGRAM REPORT   Patient Name:   Devin Marshall Date of Exam: 04/11/2024 Medical Rec #:  [id number]       Height:       67.0 in Accession #:    [id number]      Weight:  444.2 lb Date of Birth:  [birth year]        BSA:          2.839 m Patient Age:    22 years        BP:           150/84 mmHg Patient Gender: M               HR:           88 bpm. Exam Location:  Inpatient Procedure: 2D Echo (Both Spectral and Color Flow Doppler were utilized during            procedure). Indications:    elevated troponin  History:        Patient has prior history of Echocardiogram examinations, most                 recent 03/26/2024. Hypertrophic Cardiomyopathy and Left                 ventricular hypertrophy, Signs/Symptoms:Chest Pain; Risk                 Factors:Sleep Apnea.  Sonographer:    Tinnie Barefoot RDCS Referring Phys: [id number] EVA KATHEE PORE  Sonographer Comments: Patient is obese. Image acquisition challenging due to patient body habitus and patient holding breath during exam. IMPRESSIONS  1. Severe asymmetric hypertrophy of the septum up to 2.4 cm on this study. SAM of MV present. LVOT resting obstruction is present up to 35 mmHG. Findings consistent with HOCM which  are known. No provocative maneuvers performed. Left ventricular ejection  fraction, by estimation, is 65 to 70%. The left ventricle has normal function. The left ventricle has no regional wall motion abnormalities. There is severe asymmetric left ventricular hypertrophy of the septal segment. Indeterminate diastolic filling due to E-A fusion.  2. Right ventricular systolic function is normal. The right ventricular size is normal. Tricuspid regurgitation signal is inadequate for assessing PA pressure.  3. Left atrial size was mild to moderately dilated.  4. The mitral valve is grossly normal. Trivial mitral valve regurgitation. No evidence of mitral stenosis.  5. The aortic valve is tricuspid. Aortic valve regurgitation is not visualized. No aortic stenosis is present.  6. The inferior vena cava is normal in size with greater than 50% respiratory variability, suggesting right atrial pressure of 3 mmHg. FINDINGS  Left Ventricle: Severe asymmetric hypertrophy of the septum up to 2.4 cm on this study. SAM of MV present. LVOT resting obstruction is present up to 35 mmHG. Findings consistent with HOCM which are known. No provocative maneuvers performed. Left ventricular ejection fraction, by estimation, is 65 to 70%. The left ventricle has normal function. The left ventricle has no regional wall motion abnormalities. The left ventricular internal cavity size was normal in size. There is severe asymmetric left ventricular hypertrophy of the septal segment. Indeterminate diastolic filling due to E-A fusion. Right Ventricle: The right ventricular size is normal. No increase in right ventricular wall thickness. Right ventricular systolic function is normal. Tricuspid regurgitation signal is inadequate for assessing PA pressure. Left Atrium: Left atrial size was mild to moderately dilated. Right Atrium: Right atrial size was normal in size. Pericardium: There is no evidence of pericardial effusion. Mitral Valve: The mitral  valve is grossly normal. Trivial mitral valve regurgitation. No evidence of mitral valve stenosis. Tricuspid Valve: The tricuspid valve is grossly normal. Tricuspid valve regurgitation is not demonstrated. No evidence of tricuspid stenosis. Aortic Valve: The aortic valve is  tricuspid. Aortic valve regurgitation is not visualized. No aortic stenosis is present. Pulmonic Valve: The pulmonic valve was grossly normal. Pulmonic valve regurgitation is not visualized. No evidence of pulmonic stenosis. Aorta: The aortic root is normal in size and structure. Venous: The inferior vena cava is normal in size with greater than 50% respiratory variability, suggesting right atrial pressure of 3 mmHg. IAS/Shunts: The atrial septum is grossly normal.  LEFT VENTRICLE PLAX 2D LVIDd:         4.50 cm   Diastology LVIDs:         3.00 cm   LV e' medial:  5.59 cm/s LV PW:         2.00 cm   LV e' lateral: 9.17 cm/s LV IVS:        2.40 cm LVOT diam:     2.60 cm LVOT Area:     5.31 cm  RIGHT VENTRICLE             IVC RV S prime:     14.80 cm/s  IVC diam: 1.90 cm TAPSE (M-mode): 2.5 cm LEFT ATRIUM              Index LA diam:        4.80 cm  1.69 cm/m LA Vol (A2C):   117.0 ml 41.22 ml/m LA Vol (A4C):   133.0 ml 46.85 ml/m LA Biplane Vol: 128.0 ml 45.09 ml/m   AORTA Ao Root diam: 3.20 cm MR Peak grad: 154.8 mmHg MR Vmax:      622.00 cm/s SHUNTS                           Systemic Diam: 2.60 cm Darryle Decent MD Electronically signed by Darryle Decent MD Signature Date/Time: 04/11/2024/9:39:53 AM    Final    VAS US  LOWER EXTREMITY VENOUS (DVT) Result Date: 04/10/2024  Lower Venous DVT Study Patient Name:  Devin Marshall  Date of Exam:   04/10/2024 Medical Rec #: [id number]        Accession #:    [id number] Date of Birth: Aug 24, 2001         Patient Gender: M Patient Age:   41 years Exam Location:  [facility] Procedure:      VAS US  LOWER EXTREMITY VENOUS (DVT) Referring Phys: LYNWOOD SCHILLING  --------------------------------------------------------------------------------  Indications: Pain.  Risk Factors: None identified. Limitations: Body habitus and poor ultrasound/tissue interface. Comparison Study: No prior studies. Performing Technologist: Cordella Collet RVT  Examination Guidelines: A complete evaluation includes B-mode imaging, spectral Doppler, color Doppler, and power Doppler as needed of all accessible portions of each vessel. Bilateral testing is considered an integral part of a complete examination. Limited examinations for reoccurring indications may be performed as noted. The reflux portion of the exam is performed with the patient in reverse Trendelenburg.  +---------+---------------+---------+-----------+----------+-------------------+ RIGHT    CompressibilityPhasicitySpontaneityPropertiesThrombus Aging      +---------+---------------+---------+-----------+----------+-------------------+ CFV      Full           Yes      Yes                                      +---------+---------------+---------+-----------+----------+-------------------+ SFJ      Full                                                             +---------+---------------+---------+-----------+----------+-------------------+  FV Prox  Full                                                             +---------+---------------+---------+-----------+----------+-------------------+ FV Mid   Full                                                             +---------+---------------+---------+-----------+----------+-------------------+ FV Distal               Yes      Yes                                      +---------+---------------+---------+-----------+----------+-------------------+ PFV      Full                                                             +---------+---------------+---------+-----------+----------+-------------------+ POP      Full           Yes      Yes                                       +---------+---------------+---------+-----------+----------+-------------------+ PTV      Full                                                             +---------+---------------+---------+-----------+----------+-------------------+ PERO                                                  Not well visualized +---------+---------------+---------+-----------+----------+-------------------+   +---------+---------------+---------+-----------+----------+-------------------+ LEFT     CompressibilityPhasicitySpontaneityPropertiesThrombus Aging      +---------+---------------+---------+-----------+----------+-------------------+ CFV      Full           Yes      Yes                                      +---------+---------------+---------+-----------+----------+-------------------+ SFJ      Full                                                             +---------+---------------+---------+-----------+----------+-------------------+ FV Prox  Full                                                             +---------+---------------+---------+-----------+----------+-------------------+  FV Mid   Full                                                             +---------+---------------+---------+-----------+----------+-------------------+ FV DistalFull           Yes      Yes                                      +---------+---------------+---------+-----------+----------+-------------------+ PFV      Full                                                             +---------+---------------+---------+-----------+----------+-------------------+ POP      Full           Yes      Yes                                      +---------+---------------+---------+-----------+----------+-------------------+ PTV      Full                                                              +---------+---------------+---------+-----------+----------+-------------------+ PERO                                                  Not well visualized +---------+---------------+---------+-----------+----------+-------------------+     Summary: RIGHT: - There is no evidence of deep vein thrombosis in the lower extremity. However, portions of this examination were limited- see technologist comments above.  - No cystic structure found in the popliteal fossa.  LEFT: - There is no evidence of deep vein thrombosis in the lower extremity. However, portions of this examination were limited- see technologist comments above.  - No cystic structure found in the popliteal fossa.  *See table(s) above for measurements and observations. Electronically signed by Norman Serve on 04/10/2024 at 2:58:19 PM.    Final    CT Angio Chest PE W and/or Wo Contrast Result Date: 04/10/2024 CLINICAL DATA:  23 year old male with acute chest pain. Shortness of breath and dizziness. History of hypertrophic cardiomyopathy. EXAM: CT ANGIOGRAPHY CHEST WITH CONTRAST TECHNIQUE: Multidetector CT imaging of the chest was performed using the standard protocol during bolus administration of intravenous contrast. Multiplanar CT image reconstructions and MIPs were obtained to evaluate the vascular anatomy. RADIATION DOSE REDUCTION: This exam was performed according to the departmental dose-optimization program which includes automated exposure control, adjustment of the mA and/or kV according to patient size and/or use of iterative reconstruction technique. CONTRAST:  75mL OMNIPAQUE  IOHEXOL  350 MG/ML SOLN COMPARISON:  Chest radiographs 0044 hours today. FINDINGS: Cardiovascular: Suboptimal contrast bolus timing in  the pulmonary arterial tree. Two hundred Hounsfield units in the main pulmonary artery, with respiratory motion degrading detail of segmental and distal pulmonary arteries in both lungs. No central or saddle embolus. No convincing  lobar thrombus. Other pulmonary artery branches are not well evaluated. Cardiac enlargement with left ventricular myocardial hypertrophy suggested on series 7, image 235. No pericardial effusion. Negative visible aorta. Mediastinum/Nodes: Negative. No mediastinal mass or lymphadenopathy. Lungs/Pleura: Major airways are patent. Mild respiratory motion. Mild mosaic attenuation in both lungs. No consolidation or pleural effusion. Upper Abdomen: Negative visible mostly noncontrast liver, spleen, pancreas, adrenal glands, kidneys, and bowel in the upper abdomen. Musculoskeletal: Negative. Review of the MIP images confirms the above findings. IMPRESSION: 1. Limited CTA for PE due to contrast timing and respiratory motion. No central or saddle pulmonary embolus. 2. 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 No pulmonary edema or inflammation identified. Electronically Signed   By: VEAR Hurst M.D.   On: 04/10/2024 04:45   DG Chest 2 View Result Date: 04/10/2024 CLINICAL DATA:  Chest pain EXAM: CHEST - 2 VIEW COMPARISON:  08/08/2018 FINDINGS: Cardiac shadow is mildly prominent. Slight increase in vascular congestion is noted without significant edema. No focal infiltrate or effusion is noted. No bony abnormality is seen. IMPRESSION: Mild vascular congestion without edema. Electronically Signed   By: Oneil Devonshire M.D.   On: 04/10/2024 01:17   ECHOCARDIOGRAM COMPLETE Result Date: 03/26/2024    ECHOCARDIOGRAM REPORT   Patient Name:   Devin Marshall Date of Exam: 03/26/2024 Medical Rec #:  [id number]       Height:       67.0 in Accession #:    [id number]      Weight:       460.0 lb Date of Birth:  [birth year]        BSA:          2.881 m Patient Age:    22 years        BP:           126/76 mmHg Patient Gender: M               HR:           94 bpm. Exam Location:  [address] Procedure: 2D Echo, Cardiac Doppler, Color Doppler and Intracardiac            Opacification  Agent (Both Spectral and Color Flow Doppler were            utilized during procedure). Indications:    I42.2 Hypertrophic Cardiomyopathy  History:        Patient has prior history of Echocardiogram examinations, most                 recent 03/17/2023. Signs/Symptoms:Murmur; Risk Factors:Sleep                 Apnea.  Sonographer:    Carl Rodgers-Jones RDCS Sonographer#2:  Nolon Vicenta SOLES, RDCS Referring Phys: [id number] [facility] A CHANDRASEKHAR IMPRESSIONS  1. Known hx of Hypertrophic cardiomyopathy - Basal-septum is 2.77 cm     Mild SAM. LVOT gradient noted at rest 66 mmHg and with valsalva 104 mmHg. Left ventricular ejection fraction, by estimation, is 60 to 65%. The left ventricle has normal function. The left ventricle has no regional wall motion abnormalities. There is severe concentric left ventricular hypertrophy. Left ventricular diastolic parameters are indeterminate.  2. Right ventricular systolic function is normal. The right ventricular size is  normal. Tricuspid regurgitation signal is inadequate for assessing PA pressure.  3. The mitral valve is normal in structure. Mild mitral valve regurgitation. No evidence of mitral stenosis.  4. The aortic valve is normal in structure. Aortic valve regurgitation is not visualized. No aortic stenosis is present.  5. The inferior vena cava is normal in size with greater than 50% respiratory variability, suggesting right atrial pressure of 3 mmHg. Conclusion(s)/Recommendation(s): Known hx of HCM, echo enchancing image not specific for spade-like shape to suggest apical HCM but can not exclude this diagnosis. May benefit from cardiac MR. FINDINGS  Left Ventricle: Known hx of Hypertrophic cardiomyopathy - Basal-septum is 2.77 cm Mild SAM. LVOT gradient noted at rest 66 mmHg and with valsalva 104 mmHg. Left ventricular ejection fraction, by estimation, is 60 to 65%. The left ventricle has normal function. The left ventricle has no regional wall motion abnormalities.  Definity  contrast agent was given IV to delineate the left ventricular endocardial borders. The left ventricular internal cavity size was normal in size. There is severe concentric left ventricular hypertrophy. Left ventricular diastolic parameters are indeterminate. Right Ventricle: The right ventricular size is normal. No increase in right ventricular wall thickness. Right ventricular systolic function is normal. Tricuspid regurgitation signal is inadequate for assessing PA pressure. Left Atrium: Left atrial size was normal in size. Right Atrium: Right atrial size was normal in size. Pericardium: There is no evidence of pericardial effusion. Presence of epicardial fat layer. Mitral Valve: The mitral valve is normal in structure. Mild mitral valve regurgitation, with eccentric posteriorly directed jet. No evidence of mitral valve stenosis. Tricuspid Valve: The tricuspid valve is normal in structure. Tricuspid valve regurgitation is not demonstrated. No evidence of tricuspid stenosis. Aortic Valve: The aortic valve is normal in structure. Aortic valve regurgitation is not visualized. No aortic stenosis is present. Pulmonic Valve: The pulmonic valve was normal in structure. Pulmonic valve regurgitation is not visualized. No evidence of pulmonic stenosis. Aorta: The aortic root is normal in size and structure. Venous: The inferior vena cava is normal in size with greater than 50% respiratory variability, suggesting right atrial pressure of 3 mmHg. IAS/Shunts: No atrial level shunt detected by color flow Doppler.  LEFT VENTRICLE PLAX 2D LVIDd:         4.60 cm   Diastology LVIDs:         2.70 cm   LV e' medial:    9.90 cm/s LV PW:         1.94 cm   LV E/e' medial:  11.8 LV IVS:        2.20 cm   LV e' lateral:   8.05 cm/s LVOT diam:     2.40 cm   LV E/e' lateral: 14.5 LVOT Area:     4.52 cm  RIGHT VENTRICLE RV Basal diam:  3.70 cm RV S prime:     15.50 cm/s TAPSE (M-mode): 2.5 cm LEFT ATRIUM           Index        RIGHT  ATRIUM           Index LA diam:      5.70 cm 1.98 cm/m   RA Area:     12.10 cm LA Vol (A4C): 99.4 ml 34.50 ml/m  RA Volume:   24.70 ml  8.57 ml/m   AORTA Ao Root diam: 3.50 cm MITRAL VALVE MV Area (PHT): 4.01 cm     SHUNTS MV Decel Time: 189 msec  Systemic Diam: 2.40 cm MV E velocity: 117.00 cm/s MV A velocity: 89.20 cm/s MV E/A ratio:  1.31 Kardie Tobb DO Electronically signed by Dub Huntsman DO Signature Date/Time: 03/26/2024/7:00:54 PM    Final     Microbiology: Results for orders placed or performed during the hospital encounter of 04/10/24  MRSA Next Gen by PCR, Nasal     Status: None   Collection Time: 04/10/24  3:19 PM   Specimen: Nasal Mucosa; Nasal Swab  Result Value Ref Range Status   MRSA by PCR Next Gen NOT DETECTED NOT DETECTED Final    Comment: (NOTE) The GeneXpert MRSA Assay (FDA approved for NASAL specimens only), is one component of a comprehensive MRSA colonization surveillance program. It is not intended to diagnose MRSA infection nor to guide or monitor treatment for MRSA infections. Test performance is not FDA approved in patients less than 46 years old. Performed at [facility], [address], [place], [place] [address]     Labs: CBC: No results for input(s): WBC, NEUTROABS, HGB, HCT, MCV, PLT in the last 168 hours. Basic Metabolic Panel: No results for input(s): NA, K, CL, CO2, GLUCOSE, BUN, CREATININE, CALCIUM, MG, PHOS in the last 168 hours. Liver Function Tests: No results for input(s): AST, ALT, ALKPHOS, BILITOT, PROT, ALBUMIN in the last 168 hours. CBG: No results for input(s): GLUCAP in the last 168 hours.  Discharge time spent: greater than 30 minutes.  Signed: Brigida Bureau, DO [facility] 04/17/2024

## 2024-04-30 ENCOUNTER — Encounter (HOSPITAL_BASED_OUTPATIENT_CLINIC_OR_DEPARTMENT_OTHER): Payer: Self-pay

## 2024-04-30 NOTE — Progress Notes (Unsigned)
 Cardiology Office Note    Patient Name: Devin Marshall Date of Encounter: 04/30/2024  Primary Care Provider:  Lucius Krabbe, NP Primary Cardiologist:  Stanly DELENA Leavens, MD Primary Electrophysiologist: None   Past Medical History    Past Medical History:  Diagnosis Date   ADHD (attention deficit hyperactivity disorder)    Allergy    Anxiety    Asthma    Asthma    Phreesia 01/28/2020   BMI (body mass index), pediatric, > 99% for age 22/05/2015   Conjunctivitis 05/08/2013   Depression    Eating disorder    Eczema    Encounter for routine child health examination without abnormal findings 01/30/2020   Morbid obesity (HCC)    Obesity    Situational anxiety    Sleep apnea    Undiagnosed cardiac murmurs 12/10/2012    History of Present Illness  Devin Marshall is a 23 y.o. male with a PMH of hypertrophic cardiomyopathy, OSA, morbid obesity, asthma, anxiety, ADHD, HLD who presents today for posthospital follow-up.  Devin Marshall was seen initially by Dr. Arnetha in 02/15/2023 for evaluation of hypertrophic cardiomyopathy.  He completed a 2D echo that showed severe asymmetric hypertrophy with resting LVOT obstruction and EF of 65 to 70%.  During his visit he had complaints of palpitations and chest discomfort.  He had a 14-day ZIO monitor and mailed to his address however was not completed.  He was evaluated by pharmacy for consideration of GLP-1's on Zepbound  which was initiated however not continued due to medication being cost prohibitive.  He was unable to complete CMR due to weight and encouraged to continue GLP-1.  He also had another monitor mailed to his address and advised to complete once received.  He was seen in the ED on 04/10/2024 via EMS due to sudden onset of chest pain that occurred over 1 hour and was rated at 10/10.  He took a intermittent 325 mg aspirin with pain decreasing and EKG was completed showing sinus rhythm with no acute changes and troponins  at 577 and BNP was 223.  He reported lightheadedness and thumping sensation in heart which was concerning for arrhythmia. He was started on a heparin  drip and admitted for workup.  He completed a CT angio of the chest as well as lower extremity Doppler to rule out DVT that were both normal.  There was low suspicion for ACS and 2D echo was updated showing EF of 65 to 70% with no RWMA and severe asymmetric left ventricle hypertrophy of the septum and resting LVOT with a pressure up to 35 mmHg and evidence of HOCM.  The 14-day ZIO monitor results are currently still pending.  Devin Marshall presents today for posthospital follow-up and reports no further episodes of chest pain or palpitations have occurred. He is currently on metoprolol  100 mg daily, valsartan , and Lasix  20 mg daily. Lasix  helps reduce swelling in his legs and feet, and he maintains adequate hydration. His potassium levels were stable at 3.9 during his last hospital visit. He is actively trying to manage his weight through exercise, including walking daily, and is mindful of his caloric intake. He has not started GLP-1 medications and will be connected with our clinical pharmacist. No recent stress, caffeine, or alcohol intake that could have contributed to his symptoms. He describes the chest pain as internal and not tender to touch. Patient denies chest pain, palpitations, dyspnea, PND, orthopnea, nausea, vomiting, dizziness, syncope, edema, weight gain, or early satiety.  Discussed the use  of AI scribe software for clinical note transcription with the patient, who gave verbal consent to proceed.  History of Present Illness   Review of Systems  Please see the history of present illness.    All other systems reviewed and are otherwise negative except as noted above.  Physical Exam    Wt Readings from Last 3 Encounters:  04/11/24 (!) 444 lb 3.6 oz (201.5 kg)  09/12/23 (!) 460 lb (208.7 kg)  09/06/23 (!) 448 lb 3.2 oz (203.3 kg)    CD:Uyzmz were no vitals filed for this visit.,There is no height or weight on file to calculate BMI. GEN: Well nourished, well developed in no acute distress Neck: No JVD; No carotid bruits Pulmonary: Clear to auscultation without rales, wheezing or rhonchi  Cardiovascular: Normal rate. Regular rhythm. Normal S1. Normal S2.   Murmurs: 2/6 apical systolic murmur ABDOMEN: Soft, non-tender, non-distended EXTREMITIES: +1 lower extremity edema to left ankle and trace to the right ankle  EKG/LABS/ Recent Cardiac Studies   ECG personally reviewed by me today -none completed today  Risk Assessment/Calculations:          Lab Results  Component Value Date   WBC 9.9 04/10/2024   HGB 14.9 04/10/2024   HCT 44.6 04/10/2024   MCV 83.7 04/10/2024   PLT 276 04/10/2024   Lab Results  Component Value Date   CREATININE 0.96 04/10/2024   BUN 10 04/10/2024   NA 137 04/10/2024   K 3.9 04/10/2024   CL 105 04/10/2024   CO2 24 04/10/2024   Lab Results  Component Value Date   CHOL 171 (H) 10/12/2017   HDL 33 (L) 10/12/2017   LDLCALC 124 (H) 10/12/2017   TRIG 72 10/12/2017   CHOLHDL 5.8 (H) 06/18/2015    Lab Results  Component Value Date   HGBA1C 5.2 10/12/2017   Assessment & Plan    Assessment & Plan  1.  Hypertrophic cardiomyopathy:  -Emphasized weight loss for MRI facilitation. - Encourage weight loss to facilitate MRI. - Continue metoprolol , valsartan , and Lasix . - Monitor for arrhythmias with returned monitor. - Educate on avoiding overexertion during exercise. - Encourage potassium-rich diet with daily Lasix  use. - Check BMET today  2.  History of CAD/NSTEMI: -No chest pain or palpitations since last episode. Emphasized heart rate management and beta blockers' role in slowing heart rate for adequate filling time. - Continue Toprol -XL 100 mg daily - Monitor for symptoms of chest pain or palpitations. - Educate on recognizing symptoms and when to seek medical  attention.  3.  Morbid obesity: -Obesity complicating MRI and hypertrophic cardiomyopathy management. Discussed exercise, dietary changes, and potential GLP-1 medication for weight loss pending insurance coverage. - Encourage regular exercise and dietary changes. - Submit request to pharmacy for GLP-1 medication coverage. - Educate on benefits of GLP-1 medication for weight loss.  4.  Palpitations: - Patient reports no episodes of palpitations since ED visit and denies any chest discomfort -Monitor has been completed however patient was unsure of how to mail back and was encouraged to bring back to our office today for expedition of results -Continue Toprol -XL 100 mg daily  5. Lower extremity edema: -Daily Lasix  reduces leg swelling. Discussed hydration and potassium monitoring. No current compression stockings use. Encouraged leg elevation. - Encourage use of compression stockings. - Continue daily Lasix . - Encourage leg elevation when possible. - Ensure adequate hydration.    Disposition: Follow-up with Stanly DELENA Leavens, MD or APP in 3 months    Signed, Wyn  Mickey Jackee Shove, NP 04/30/2024, 7:25 AM [facility]

## 2024-05-01 ENCOUNTER — Ambulatory Visit (INDEPENDENT_AMBULATORY_CARE_PROVIDER_SITE_OTHER): Admitting: Family

## 2024-05-01 ENCOUNTER — Encounter: Payer: Self-pay | Admitting: Family

## 2024-05-01 ENCOUNTER — Encounter: Payer: Self-pay | Admitting: Nurse Practitioner

## 2024-05-01 ENCOUNTER — Ambulatory Visit: Attending: Cardiovascular Disease | Admitting: Nurse Practitioner

## 2024-05-01 VITALS — BP 115/83 | HR 76 | Temp 97.0°F | Ht 67.0 in | Wt >= 6400 oz

## 2024-05-01 VITALS — BP 128/82 | HR 87 | Resp 12 | Ht 67.0 in | Wt >= 6400 oz

## 2024-05-01 DIAGNOSIS — F419 Anxiety disorder, unspecified: Secondary | ICD-10-CM

## 2024-05-01 DIAGNOSIS — L989 Disorder of the skin and subcutaneous tissue, unspecified: Secondary | ICD-10-CM | POA: Diagnosis not present

## 2024-05-01 DIAGNOSIS — E782 Mixed hyperlipidemia: Secondary | ICD-10-CM | POA: Diagnosis not present

## 2024-05-01 DIAGNOSIS — R600 Localized edema: Secondary | ICD-10-CM

## 2024-05-01 DIAGNOSIS — I422 Other hypertrophic cardiomyopathy: Secondary | ICD-10-CM

## 2024-05-01 DIAGNOSIS — R440 Auditory hallucinations: Secondary | ICD-10-CM | POA: Insufficient documentation

## 2024-05-01 DIAGNOSIS — R002 Palpitations: Secondary | ICD-10-CM

## 2024-05-01 DIAGNOSIS — F32A Depression, unspecified: Secondary | ICD-10-CM

## 2024-05-01 LAB — BASIC METABOLIC PANEL WITH GFR
BUN/Creatinine Ratio: 11 (ref 9–20)
BUN: 11 mg/dL (ref 6–20)
CO2: 23 mmol/L (ref 20–29)
Calcium: 9.3 mg/dL (ref 8.7–10.2)
Chloride: 102 mmol/L (ref 96–106)
Creatinine, Ser: 0.97 mg/dL (ref 0.76–1.27)
Glucose: 80 mg/dL (ref 70–99)
Potassium: 4.6 mmol/L (ref 3.5–5.2)
Sodium: 140 mmol/L (ref 134–144)
eGFR: 114 mL/min/1.73 (ref >59)

## 2024-05-01 MED ORDER — TRIAMCINOLONE ACETONIDE 0.5 % EX CREA: 1.0000 | TOPICAL_CREAM | Freq: Two times a day (BID) | CUTANEOUS | 2 refills | Status: AC

## 2024-05-01 NOTE — Progress Notes (Signed)
 Patient ID: Devin Marshall, male    DOB: 2000/10/11, 23 y.o.   MRN: [id number]  Chief Complaint  Patient presents with   Hair/Scalp Problem    Pt c/o bumps on back of neck, painful and swelling. Has tried shampoo and rubbing alcohol, which does help slightly.   Discussed the use of AI scribe software for clinical note transcription with the patient, who gave verbal consent to proceed.  History of Present Illness   Devin Marshall is a 23 year old male who presents with persistent scalp bumps and auditory hallucinations.  Scalp lesions - Persistent, non-painful, non-pruritic bumps on the scalp present for several years, onset around age 72 or 23 yo after getting razor burn rash from bad haircut - Temporary improvement with recommended OTC red shampoo, does not remember name, but lesions returned to baseline even while using daily - No recent dermatology evaluation - No prescription treatments for scalp lesions - Brother also has same lesions   Auditory hallucinations and social withdrawal - Auditory hallucinations characterized by hearing voices such as his parents calling him or doors opening when no one is present - Associated with increased social withdrawal, spending more time isolated in his room - Poor sleep quality - History of depression and anxiety - Previously treated with bupropion  and possibly escitalopram, but not currently on psychiatric medications      Assessment & Plan:     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 Previous OTC treatments provided temporary relief. - Sending Dermatology referral, advised office in [place] can see sooner if willing to travel, pt to check with mother. - Prescribed steroid cream, Triamcinolone  0.5% mixed with body cream for application bid. - Advised against alcohol use on lesions. - Photographed lesions for medical  record. - Will research condition further and communicate additional treatment options via MyChart.  Cardiomyopathy and Morbid Obesity Recently seen in ED for exacerbation of symptoms. Reports he was on Zepbound  for about a year and did lose weight, but then it became hard to find and he stopped getting. Denies any cost issue or bothersome side effects. Review of Cardiology note today mentions cost issue as a barrier.  - Will reach out to Cardiology provider to see about restarting Zepbound . - Advised pt to also reach to Cardiology provider and ask about getting restarted on zepbound .  Auditory hallucinations with depression and anxiety Recent auditory hallucinations, depression, and anxiety exacerbated by stress. No current psychiatric treatment. Previous treatment with bupropion  and possibly Lexapro. PHQ score 16 and GAD 11 in office today. - Referred to psychiatrist for evaluation and management. - Provided contact number for psychiatric office to schedule appointment. - Advised follow-up if no contact by next week.     Subjective:    Outpatient Medications Prior to Visit  Medication Sig Dispense Refill   budesonide  (PULMICORT ) 1 MG/2ML nebulizer solution TAKE 2 ML (1 MG TOTAL) BY NEBULIZATION DAILY 180 mL 5   levalbuterol  (XOPENEX  HFA) 45 MCG/ACT inhaler Inhale 1-2 puffs into the lungs every 8 (eight) hours as needed for wheezing or shortness of breath. 15 g 0   metoprolol  succinate (TOPROL -XL) 100 MG 24 hr tablet Take 1 tablet (100 mg total) by mouth daily. Take with or immediately following a meal. 30 tablet 0   valsartan  (DIOVAN ) 40 MG tablet Take 1 tablet (40 mg total) by mouth daily. Please monitor BP. NEED OV. 90 tablet 0   furosemide  (LASIX )  20 MG tablet Take 1 tablet (20 mg total) by mouth daily as needed (leg swelling). (Patient taking differently: Take 20 mg by mouth daily.) 30 tablet 11   No facility-administered medications prior to visit.   Past Medical History:  Diagnosis  Date   ADHD (attention deficit hyperactivity disorder)    Allergy    Anxiety    Asthma    Asthma    Phreesia 01/28/2020   BMI (body mass index), pediatric, > 99% for age 22/05/2015   Conjunctivitis 05/08/2013   Depression    Eating disorder    Eczema    Encounter for routine child health examination without abnormal findings 01/30/2020   Morbid obesity (HCC)    Obesity    Situational anxiety    Sleep apnea    Undiagnosed cardiac murmurs 12/10/2012   Past Surgical History:  Procedure Laterality Date   CIRCUMCISION     No Active Allergies    Objective:    Physical Exam Vitals and nursing note reviewed.  Constitutional:      General: He is not in acute distress.    Appearance: Normal appearance. He is morbidly obese.  HENT:     Head: Normocephalic.  Cardiovascular:     Rate and Rhythm: Normal rate and regular rhythm.  Pulmonary:     Effort: Pulmonary effort is normal.     Breath sounds: Normal breath sounds.  Musculoskeletal:        General: Normal range of motion.     Cervical back: Normal range of motion.  Skin:    General: Skin is warm and dry.     Findings: Lesion (multiple tiny raised bumps in cluster on posterior neck at bottom of hairline, no drainage, excoriation, or erythema noted, see pic) present.  Neurological:     Mental Status: He is alert and oriented to person, place, and time.  Psychiatric:        Mood and Affect: Mood normal.     BP 115/83 (BP Location: Left Arm, Patient Position: Sitting, Cuff Size: Large)   Pulse 76   Temp (!) 97 F (36.1 C) (Temporal)   Ht 5' 7 (1.702 m)   Wt (!) 449 lb 8 oz (203.9 kg)   SpO2 96%   BMI 70.40 kg/m  Wt Readings from Last 3 Encounters:  05/01/24 (!) 449 lb 8 oz (203.9 kg)  05/01/24 (!) 447 lb (202.8 kg)  04/11/24 (!) 444 lb 3.6 oz (201.5 kg)   I personally spent a total of 35 minutes in the care of the patient today including getting/reviewing separately obtained history, performing a medically  appropriate exam/evaluation, counseling and educating, placing orders, referring and communicating with other health care professionals, and documenting clinical information in the EHR.   Lucius Krabbe, NP

## 2024-05-01 NOTE — Patient Instructions (Addendum)
 Medication Instructions:  Your physician recommends that you continue on your current medications as directed. Please refer to the Current Medication list given to you today. *If you need a refill on your cardiac medications before your next appointment, please call your pharmacy*  Lab Work: TODAY-BMET If you have labs (blood work) drawn today and your tests are completely normal, you will receive your results only by: MyChart Message (if you have MyChart) OR A paper copy in the mail If you have any lab test that is abnormal or we need to change your treatment, we will call you to review the results.  Testing/Procedures: NONE ORDERED  Follow-Up: At [facility], you and your health needs are our priority.  As part of our continuing mission to provide you with exceptional heart care, our providers are all part of one team.  This team includes your primary Cardiologist (physician) and Advanced Practice Providers or APPs (Physician Assistants and Nurse Practitioners) who all work together to provide you with the care you need, when you need it.  Your next appointment:   3 month(s)  Provider:   Stanly DELENA Leavens, MD or Orren Fabry, PA   We recommend signing up for the patient portal called MyChart.  Sign up information is provided on this After Visit Summary.  MyChart is used to connect with patients for Virtual Visits (Telemedicine).  Patients are able to view lab/test results, encounter notes, upcoming appointments, etc.  Non-urgent messages can be sent to your provider as well.   To learn more about what you can do with MyChart, go to [email or web address].   Other Instructions PLEASE BRING YOUR MONITOR BACK TO THE OFFICE

## 2024-05-01 NOTE — Assessment & Plan Note (Signed)
 Recently seen in ED for exacerbation of symptoms. Reports he was on Zepbound  for about a year and did lose weight, but then it became hard to find and he stopped getting. Denies any cost issue or bothersome side effects. Review of Cardiology note today mentions cost issue as a barrier.  - Will reach out to Cardiology provider to see about restarting Zepbound . - Advised pt to also reach to Cardiology provider and ask about getting restarted on zepbound .

## 2024-05-01 NOTE — Patient Instructions (Addendum)
 It was very nice to see you today!   I have sent over a steroid cream to use on your scalp bumps. Use this twice a day with equal amount of body or facial cream mixed together to prevent skin lightening. Let me know if this is not working. I may send something else but I will let you know.  Also sending a referral to Dermatology but it does take several months at least to get an appointment. Let me know if you are ok with going to [facility] and you can get an appointment sooner. It is called [facility].  I also am sending a referral to our Psychiatry office. But you usually have to call their office to schedule your first appointment, [facility] at [phone].        PLEASE NOTE:  If you had any lab tests please let us  know if you have not heard back within a few days. You may see your results on MyChart before we have a chance to review them but we will give you a call once they are reviewed by us . If we ordered any referrals today, please let us  know if you have not heard from their office within the next week.

## 2024-05-02 ENCOUNTER — Ambulatory Visit: Payer: Self-pay | Admitting: Nurse Practitioner

## 2024-05-02 NOTE — Progress Notes (Deleted)
 Office Visit    Patient Name: Devin Marshall Date of Encounter: 05/02/2024  Primary Care Provider:  Lucius Krabbe, NP Primary Cardiologist:  Stanly DELENA Leavens, MD  Chief Complaint    Weight management  Significant Past Medical History   CAD NSTEMI  Hypertrophic cardiomyopathy   OSA   Anxiety/depresssion           No Active Allergies  History of Present Illness    Devin Marshall is a 23 y.o. male patient of Dr Devin Marshall, in the office today to discuss options for weight management.   Devin Marshall has been on Zepbound  in the past and dropped about 13-14 pounds, but did not follow through due to cost of medication???  *** If diabetic and on insulin /sulfonylurea, can consider reducing dose to reduce risk of hypoglycemia  *** Follow-up visit  Assess % weight loss Assess adverse effects Missed doses  Current weight management medications:   Previously tried meds:   Current meds that may affect weight:   Baseline weight/BMI: 203.9 kg // 70.39  Insurance payor: Child psychotherapist  Diet:   Exercise:   Family History:   Confirmed patient not ***pregnant and no personal or family history of medullary thyroid carcinoma (MTC) or Multiple Endocrine Neoplasia syndrome type 2 (MEN 2).   Social History:   Tobacco:  Alcohol:  Caffeine:   Adherence Assessment  Do you ever forget to take your medication? [] Yes [] No  Do you ever skip doses due to side effects? [] Yes [] No  Do you have trouble affording your medicines? [] Yes [] No  Are you ever unable to pick up your medication due to transportation difficulties? [] Yes [] No  Do you ever stop taking your medications because you don't believe they are helping? [] Yes [] No  Do you check your weight daily? [] Yes [] No   Adherence strategy: ***  Barriers to obtaining medications: ***   Accessory Clinical Findings    Lab Results  Component Value Date   CREATININE 0.97 05/01/2024   BUN 11  05/01/2024   NA 140 05/01/2024   K 4.6 05/01/2024   CL 102 05/01/2024   CO2 23 05/01/2024   Lab Results  Component Value Date   ALT 17 08/08/2018   AST 14 (L) 08/08/2018   ALKPHOS 46 (L) 08/08/2018   BILITOT 0.5 08/08/2018   Lab Results  Component Value Date   HGBA1C 5.2 10/12/2017      Home Medications/Allergies    Current Outpatient Medications  Medication Sig Dispense Refill   budesonide  (PULMICORT ) 1 MG/2ML nebulizer solution TAKE 2 ML (1 MG TOTAL) BY NEBULIZATION DAILY 180 mL 5   furosemide  (LASIX ) 20 MG tablet Take 1 tablet (20 mg total) by mouth daily as needed (leg swelling). (Patient taking differently: Take 20 mg by mouth daily.) 30 tablet 11   levalbuterol  (XOPENEX  HFA) 45 MCG/ACT inhaler Inhale 1-2 puffs into the lungs every 8 (eight) hours as needed for wheezing or shortness of breath. 15 g 0   metoprolol  succinate (TOPROL -XL) 100 MG 24 hr tablet Take 1 tablet (100 mg total) by mouth daily. Take with or immediately following a meal. 30 tablet 0   triamcinolone  cream (KENALOG ) 0.5 % Apply 1 Application topically 2 (two) times daily. Apply equal drops of this cream with a body or facial cream, mix together, then apply to bumps. Call office if not working. 30 g 2   valsartan  (DIOVAN ) 40 MG tablet Take 1 tablet (40 mg total) by mouth daily. Please monitor BP.  NEED OV. 90 tablet 0   No current facility-administered medications for this visit.     No Active Allergies  Assessment & Plan    No problem-specific Assessment & Plan notes found for this encounter.   Kerstin Crusoe PharmD CPP [facility]  [address] [place], [place] [address] [phone]

## 2024-05-03 ENCOUNTER — Telehealth: Payer: Self-pay | Admitting: Pharmacist

## 2024-05-03 ENCOUNTER — Ambulatory Visit
Attending: Pharmacist Clinician (PhC)/ Clinical Pharmacy Specialist | Admitting: Pharmacist Clinician (PhC)/ Clinical Pharmacy Specialist

## 2024-05-03 NOTE — Telephone Encounter (Signed)
-----   Message from Wyn Raddle, Jackee Shove sent at 05/01/2024  8:52 AM EDT ----- Good morning,  Mr. Abair was seen today in follow-up after recent NSTEMI.  I was reaching out to see if his NSTEMI diagnosis along with his morbid obesity with qualify him for GLP-1 coverage?  Thanks, Jackee Wyn, NP

## 2024-05-06 ENCOUNTER — Telehealth: Payer: Self-pay | Admitting: Pharmacy Technician

## 2024-05-06 ENCOUNTER — Other Ambulatory Visit (HOSPITAL_COMMUNITY): Payer: Self-pay

## 2024-05-06 NOTE — Telephone Encounter (Signed)
 Pharmacy Patient Advocate Encounter  Received notification from [facility] that Prior Authorization for wegovy 0.25mg  has been APPROVED from 05/06/24 to 12/02/24. Ran test claim, Copay is $653.68. This test claim was processed through [facility]- copay amounts may vary at other pharmacies due to pharmacy/plan contracts, or as the patient moves through the different stages of their insurance plan.   PA #/Case ID/Reference #: [id number]    I got him a coupon and put in [facility] but it was still 428.68 for 1 month   Please note: This offer is subject to a maximum savings of $225 per month.

## 2024-05-06 NOTE — Telephone Encounter (Signed)
 Per pt calls  Pharmacy Patient Advocate Encounter   Received notification from Pt Calls Messages that prior authorization for [facility] is required/requested.   Insurance verification completed.   The patient is insured through [facility] .   Per test claim: PA required; PA submitted to above mentioned insurance via Latent Key/confirmation #/EOC [facility] Status is pending

## 2024-05-06 NOTE — Telephone Encounter (Signed)
   Please note: This offer is subject to a maximum savings of $225 per month.

## 2024-05-09 ENCOUNTER — Other Ambulatory Visit: Payer: Self-pay | Admitting: Internal Medicine

## 2024-05-15 NOTE — Telephone Encounter (Signed)
 PA for [facility] approved but co-pay is high call patient and mother to discuss co-pay and med assist co-pay card - N/A LVM

## 2024-05-16 NOTE — Telephone Encounter (Signed)
 Pts mom requesting a c/b .

## 2024-05-30 ENCOUNTER — Other Ambulatory Visit (HOSPITAL_COMMUNITY): Payer: Self-pay

## 2024-05-30 MED ORDER — WEGOVY 0.25 MG/0.5ML ~~LOC~~ SOAJ
0.2500 mg | SUBCUTANEOUS | 0 refills | Status: DC
  Filled 2024-05-30: qty 2, 28d supply, fill #0

## 2024-05-30 MED ORDER — WEGOVY 0.25 MG/0.5ML ~~LOC~~ SOAJ
0.2500 mg | SUBCUTANEOUS | 0 refills | Status: DC
  Filled 2024-06-28: qty 2, 28d supply, fill #0

## 2024-05-30 NOTE — Addendum Note (Signed)
 Addended by: Chaley Castellanos K on: 05/30/2024 04:20 PM   Modules accepted: Orders

## 2024-05-30 NOTE — Telephone Encounter (Signed)
 Was abel to speak to mother. Ok with $ 225 copay. As [facility]. But the co-pay is $428.68. will let mother know via MyChart

## 2024-06-23 ENCOUNTER — Other Ambulatory Visit: Payer: Self-pay | Admitting: Internal Medicine

## 2024-06-27 ENCOUNTER — Other Ambulatory Visit (HOSPITAL_BASED_OUTPATIENT_CLINIC_OR_DEPARTMENT_OTHER): Payer: Self-pay

## 2024-06-28 ENCOUNTER — Other Ambulatory Visit (HOSPITAL_COMMUNITY): Payer: Self-pay

## 2024-07-02 ENCOUNTER — Other Ambulatory Visit (HOSPITAL_COMMUNITY): Payer: Self-pay

## 2024-07-17 ENCOUNTER — Other Ambulatory Visit: Payer: Self-pay | Admitting: Internal Medicine

## 2024-07-17 ENCOUNTER — Other Ambulatory Visit (HOSPITAL_COMMUNITY): Payer: Self-pay

## 2024-07-17 MED ORDER — WEGOVY 0.5 MG/0.5ML ~~LOC~~ SOAJ
0.5000 mg | SUBCUTANEOUS | 0 refills | Status: AC
  Filled 2024-07-17: qty 2, 28d supply, fill #0

## 2024-07-18 ENCOUNTER — Encounter (HOSPITAL_COMMUNITY): Payer: Self-pay

## 2024-07-18 ENCOUNTER — Other Ambulatory Visit (HOSPITAL_COMMUNITY): Payer: Self-pay

## 2024-08-19 ENCOUNTER — Other Ambulatory Visit: Payer: Self-pay | Admitting: Internal Medicine

## 2024-08-20 ENCOUNTER — Telehealth: Payer: Self-pay | Admitting: Internal Medicine

## 2024-08-20 MED ORDER — METOPROLOL SUCCINATE ER 100 MG PO TB24: 100.0000 mg | ORAL_TABLET | Freq: Every day | ORAL | 1 refills | Status: DC

## 2024-08-20 NOTE — Telephone Encounter (Signed)
" °*  STAT* If patient is at the pharmacy, call can be transferred to refill team.   1. Which medications need to be refilled? (please list name of each medication and dose if known)  metoprolol  succinate (TOPROL -XL) 100 MG 24 hr tablet   2. Would you like to learn more about the convenience, safety, & potential cost savings by using the [facility]? no   3. Are you open to using the [facility] ([facility]. no   4. Which pharmacy/location (including street and city if local pharmacy) is medication to be sent to? [id number] [place],  - [address]      5. Do they need a 30 day or 90 day supply? 90 day    Pt completely out.  "

## 2024-08-20 NOTE — Telephone Encounter (Signed)
 Pt scheduled 10/15/24, refill sent.

## 2024-09-11 ENCOUNTER — Other Ambulatory Visit: Payer: Self-pay | Admitting: Internal Medicine

## 2024-10-15 ENCOUNTER — Ambulatory Visit: Admitting: Physician Assistant

## 2024-12-02 ENCOUNTER — Ambulatory Visit: Admitting: Physician Assistant
